# Patient Record
Sex: Male | Born: 1965
Health system: Southern US, Community
[De-identification: ages and names within clinical notes are randomized; demographics above are authoritative.]

## PROBLEM LIST (undated history)

## (undated) DIAGNOSIS — S86019A Strain of unspecified Achilles tendon, initial encounter: Secondary | ICD-10-CM

## (undated) HISTORY — DX: Strain of unspecified achilles tendon, initial encounter: S86.019A

## (undated) HISTORY — PX: EYE MUSCLE SURGERY: SHX370

---

## 2013-01-01 ENCOUNTER — Emergency Department (HOSPITAL_COMMUNITY)
Admission: EM | Admit: 2013-01-01 | Discharge: 2013-01-01 | Disposition: A | Discharge status: Home / Self Care | Payer: No Typology Code available for payment source | Attending: Emergency Medicine | Admitting: Emergency Medicine

## 2013-01-01 ENCOUNTER — Encounter (HOSPITAL_COMMUNITY): Payer: Self-pay | Admitting: Emergency Medicine

## 2013-01-01 DIAGNOSIS — K047 Periapical abscess without sinus: Secondary | ICD-10-CM

## 2013-01-01 MED ORDER — PENICILLIN V POTASSIUM 250 MG PO TABS
500.0000 mg | ORAL_TABLET | Freq: Once | ORAL | Status: AC
  Administered 2013-01-01: 500 mg via ORAL
  Filled 2013-01-01: qty 2

## 2013-01-01 MED ORDER — OXYCODONE-ACETAMINOPHEN 5-325 MG PO TABS
2.0000 | ORAL_TABLET | Freq: Once | ORAL | Status: AC
  Administered 2013-01-01: 2 via ORAL
  Filled 2013-01-01: qty 2

## 2013-01-01 MED ORDER — OXYCODONE-ACETAMINOPHEN 5-325 MG PO TABS: 1.0000 | ORAL_TABLET | ORAL | Status: DC | PRN

## 2013-01-01 MED ORDER — PENICILLIN V POTASSIUM 500 MG PO TABS: 500.0000 mg | ORAL_TABLET | Freq: Three times a day (TID) | ORAL | Status: DC

## 2013-01-01 NOTE — ED Notes (Signed)
Pt c/o left lower dental pain x several days

## 2013-01-01 NOTE — ED Provider Notes (Signed)
   History    This chart was scribed for non-physician practitioner Arnoldo Hooker PA-C working with Carleene Cooper III, MD by Donne Anon, ED Scribe. This patient was seen in room TR08C/TR08C and the patient's care was started at 1825.  CSN: [id number] Arrival date & time 01/01/13  1704  None    Chief Complaint  Patient presents with  . Dental Pain    The history is provided by the patient. No language interpreter was used.   HPI Comments: Cameron Howell is a 47 y.o. male who presents to the Emergency Department complaining of several days of gradual onset, gradually worsening, constant, severe lower left side dental pain that began swelling today. He denies fever.   History reviewed. No pertinent past medical history. History reviewed. No pertinent past surgical history. History reviewed. No pertinent family history. History  Substance Use Topics  . Smoking status: Never Smoker   . Smokeless tobacco: Not on file  . Alcohol Use: No    Review of Systems  HENT: Positive for dental problem. Negative for trouble swallowing.   All other systems reviewed and are negative.    Allergies  Review of patient's allergies indicates no known allergies.  Home Medications  No current outpatient prescriptions on file. BP 132/83  Pulse 74  Temp(Src) 98 F (36.7 C) (Oral)  Resp 18  SpO2 98% Physical Exam  Nursing note and vitals reviewed. Constitutional: He appears well-developed and well-nourished. No distress.  HENT:  Head: Normocephalic and atraumatic.  Partially edentulous. #22 with significant decay. Moderate swelling to adjacent gum tissue. Tender to palpation. Submental adenopathy. Mild left sided facial swelling consistent with dental abscess.   Eyes: Conjunctivae are normal.  Neck: Neck supple. No tracheal deviation present.  Cardiovascular: Normal rate.   Pulmonary/Chest: Effort normal. No respiratory distress.  Musculoskeletal: Normal range of motion.  Neurological: He is  alert.  Skin: Skin is warm and dry.  Psychiatric: He has a normal mood and affect. His behavior is normal.    ED Course  Procedures (including critical care time) DIAGNOSTIC STUDIES: Oxygen Saturation is 98% on RA, normal by my interpretation.    COORDINATION OF CARE: 6:29 PM Discussed treatment plan which includes pain medication and antibiotics with pt at bedside and pt agreed to plan. Dental referral given. Advised pt to use warm compresses.   Labs Reviewed - No data to display No results found. No diagnosis found.  MDM  Uncomplicated dental abscess. Abx and pain medication.  I personally performed the services described in this documentation, which was scribed in my presence. The recorded information has been reviewed and is accurate.      Arnoldo Hooker, PA-C 01/01/13 1838

## 2013-01-02 NOTE — ED Provider Notes (Signed)
Medical screening examination/treatment/procedure(s) were performed by non-physician practitioner and as supervising physician I was immediately available for consultation/collaboration.   Carleene Cooper III, MD 01/02/13 [phone]

## 2013-03-26 ENCOUNTER — Ambulatory Visit: Payer: PRIVATE HEALTH INSURANCE | Admitting: Family Medicine

## 2013-03-26 DIAGNOSIS — Z0289 Encounter for other administrative examinations: Secondary | ICD-10-CM

## 2013-05-01 ENCOUNTER — Ambulatory Visit: Payer: PRIVATE HEALTH INSURANCE | Admitting: Internal Medicine

## 2013-05-21 ENCOUNTER — Ambulatory Visit: Payer: 59 | Admitting: Internal Medicine

## 2013-05-24 ENCOUNTER — Encounter: Payer: Self-pay | Admitting: Internal Medicine

## 2013-05-24 ENCOUNTER — Ambulatory Visit (INDEPENDENT_AMBULATORY_CARE_PROVIDER_SITE_OTHER): Payer: 59 | Admitting: Internal Medicine

## 2013-05-24 VITALS — BP 120/78 | HR 76 | Temp 98.1°F | Ht 73.5 in | Wt 191.0 lb

## 2013-05-24 DIAGNOSIS — Z113 Encounter for screening for infections with a predominantly sexual mode of transmission: Secondary | ICD-10-CM

## 2013-05-24 DIAGNOSIS — M7660 Achilles tendinitis, unspecified leg: Secondary | ICD-10-CM

## 2013-05-24 DIAGNOSIS — F17200 Nicotine dependence, unspecified, uncomplicated: Secondary | ICD-10-CM

## 2013-05-24 DIAGNOSIS — Z0000 Encounter for general adult medical examination without abnormal findings: Secondary | ICD-10-CM

## 2013-05-24 LAB — LIPID PANEL
HDL: 26.3 mg/dL — ABNORMAL LOW (ref >39.00)
Total CHOL/HDL Ratio: 6
VLDL: 20.4 mg/dL (ref 0.0–40.0)

## 2013-05-24 LAB — COMPREHENSIVE METABOLIC PANEL
ALT: 26 U/L (ref 0–53)
AST: 21 U/L (ref 0–37)
Calcium: 9.3 mg/dL (ref 8.4–10.5)
Chloride: 105 mEq/L (ref 96–112)
Creatinine, Ser: 1 mg/dL (ref 0.4–1.5)
Sodium: 140 mEq/L (ref 135–145)
Total Protein: 7.4 g/dL (ref 6.0–8.3)

## 2013-05-24 LAB — TSH: TSH: 0.71 u[IU]/mL (ref 0.35–5.50)

## 2013-05-24 LAB — CBC
Hemoglobin: 14.6 g/dL (ref 13.0–17.0)
MCHC: 33.5 g/dL (ref 30.0–36.0)
RDW: 13.3 % (ref 11.5–14.6)

## 2013-05-24 MED ORDER — VARENICLINE TARTRATE 0.5 MG X 11 & 1 MG X 42 PO MISC: ORAL | Status: DC

## 2013-05-24 NOTE — Progress Notes (Signed)
HPI Pt presents to the clinic today to establish care. He recently moved from [place]. He has not had a physical in > 2 years and would like to get one today. He does have some concern today about some of his left Achilles heel- he did snap it in the past and he feels like he does not get good ROM. He would like referral to a specialist. He also has some concerns with smoking. He would like to quit. He has tried Chantix and the patches in the past without good success. He is wondering what other options there are.  Flu: never Tetanus: unsure Eye Doctor: yearly Dentist: as needed  History reviewed. No pertinent past medical history.  No current outpatient prescriptions on file.   No current facility-administered medications for this visit.    No Known Allergies  Family History  Problem Relation Age of Onset  . Cancer Mother   . Diabetes Mother   . Cancer Father     History   Social History  . Marital Status: Married    Spouse Name: N/A    Number of Children: N/A  . Years of Education: N/A   Occupational History  . Not on file.   Social History Main Topics  . Smoking status: Current Every Day Smoker -- 1.00 packs/day    Types: Cigarettes  . Smokeless tobacco: Not on file     Comment: 20 years smoking  . Alcohol Use: No  . Drug Use: No  . Sexual Activity: Not on file   Other Topics Concern  . Not on file   Social History Narrative  . No narrative on file    ROS:  Constitutional: Denies fever, malaise, fatigue, headache or abrupt weight changes.  HEENT: Denies eye pain, eye redness, ear pain, ringing in the ears, wax buildup, runny nose, nasal congestion, bloody nose, or sore throat. Respiratory: Denies difficulty breathing, shortness of breath, cough or sputum production.   Cardiovascular: Denies chest pain, chest tightness, palpitations or swelling in the hands or feet.  Gastrointestinal: Denies abdominal pain, bloating, constipation, diarrhea or blood in the  stool.  GU: He does have difficulty maintaining an erection at times. Denies frequency, urgency, pain with urination, blood in urine, odor or discharge. Musculoskeletal: Pt reports pain in left achilles heel. Denies difficulty with gait, muscle pain or joint pain and swelling.  Skin: Denies redness, rashes, lesions or ulcercations.  Neurological: Denies dizziness, difficulty with memory, difficulty with speech or problems with balance and coordination.   No other specific complaints in a complete review of systems (except as listed in HPI above).  PE:  BP 120/78  Pulse 76  Temp(Src) 98.1 F (36.7 C) (Oral)  Ht 6' 1.5" (1.867 m)  Wt 191 lb (86.637 kg)  BMI 24.86 kg/m2  SpO2 99% Wt Readings from Last 3 Encounters:  05/24/13 191 lb (86.637 kg)    General: Appears his stated age, well developed, well nourished in NAD. HEENT: Head: normal shape and size; Eyes: sclera white, no icterus, conjunctiva pink, PERRLA and EOMs intact; Ears: Tm's gray and intact, normal light reflex; Nose: mucosa pink and moist, septum midline; Throat/Mouth: Teeth present, mucosa pink and moist, no lesions or ulcerations noted.  Neck: Normal range of motion. Neck supple, trachea midline. No massses, lumps or thyromegaly present.  Cardiovascular: Normal rate and rhythm. S1,S2 noted.  No murmur, rubs or gallops noted. No JVD or BLE edema. No carotid bruits noted. Pulmonary/Chest: Normal effort and positive vesicular breath sounds.  No respiratory distress. No wheezes, rales or ronchi noted.  Abdomen: Soft and nontender. Normal bowel sounds, no bruits noted. No distention or masses noted. Liver, spleen and kidneys non palpable. Musculoskeletal: Normal range of motion. No signs of joint swelling. No difficulty with gait. Mild weakness noted in the LLE, strength 4/5 LLE, 5/5 RLE. Neurological: Alert and oriented. Cranial nerves II-XII intact. Coordination normal. +DTRs bilaterally. Psychiatric: Mood and affect normal.  Behavior is normal. Judgment and thought content normal.      Assessment and Plan:  Preventative Health Maintenance:  Pt declines flu and tetanus vaccines He would like screening labs today including for screening for STD's Smoking cessation counseling and education provided- pt would like to try Chantix again  Left achilles heel pain:  I will have him make an appt with Dr. Patsy Lager for further evaluation  RTC in 1 year or sooner if needed

## 2013-05-24 NOTE — Progress Notes (Signed)
Pre-visit discussion using our clinic review tool. No additional management support is needed unless otherwise documented below in the visit note.  

## 2013-05-24 NOTE — Addendum Note (Signed)
Addended by: Baldomero Lamy on: 05/24/2013 10:36 AM   Modules accepted: Orders

## 2013-05-24 NOTE — Patient Instructions (Addendum)
Health Maintenance, Males A healthy lifestyle and preventative care can promote health and wellness.  Maintain regular health, dental, and eye exams.  Eat a healthy diet. Foods like vegetables, fruits, whole grains, low-fat dairy products, and lean protein foods contain the nutrients you need without too many calories. Decrease your intake of foods high in solid fats, added sugars, and salt. Get information about a proper diet from your caregiver, if necessary.  Regular physical exercise is one of the most important things you can do for your health. Most adults should get at least 150 minutes of moderate-intensity exercise (any activity that increases your heart rate and causes you to sweat) each week. In addition, most adults need muscle-strengthening exercises on 2 or more days a week.   Maintain a healthy weight. The body mass index (BMI) is a screening tool to identify possible weight problems. It provides an estimate of body fat based on height and weight. Your caregiver can help determine your BMI, and can help you achieve or maintain a healthy weight. For adults 20 years and older:  A BMI below 18.5 is considered underweight.  A BMI of 18.5 to 24.9 is normal.  A BMI of 25 to 29.9 is considered overweight.  A BMI of 30 and above is considered obese.  Maintain normal blood lipids and cholesterol by exercising and minimizing your intake of saturated fat. Eat a balanced diet with plenty of fruits and vegetables. Blood tests for lipids and cholesterol should begin at age 20 and be repeated every 5 years. If your lipid or cholesterol levels are high, you are over 50, or you are a high risk for heart disease, you may need your cholesterol levels checked more frequently.Ongoing high lipid and cholesterol levels should be treated with medicines, if diet and exercise are not effective.  If you smoke, find out from your caregiver how to quit. If you do not use tobacco, do not start.  Lung  cancer screening is recommended for adults aged 55 80 years who are at high risk for developing lung cancer because of a history of smoking. Yearly low-dose computed tomography (CT) is recommended for people who have at least a 30-pack-year history of smoking and are a current smoker or have quit within the past 15 years. A pack year of smoking is smoking an average of 1 pack of cigarettes a day for 1 year (for example: 1 pack a day for 30 years or 2 packs a day for 15 years). Yearly screening should continue until the smoker has stopped smoking for at least 15 years. Yearly screening should also be stopped for people who develop a health problem that would prevent them from having lung cancer treatment.  If you choose to drink alcohol, do not exceed 2 drinks per day. One drink is considered to be 12 ounces (355 mL) of beer, 5 ounces (148 mL) of wine, or 1.5 ounces (44 mL) of liquor.  Avoid use of street drugs. Do not share needles with anyone. Ask for help if you need support or instructions about stopping the use of drugs.  High blood pressure causes heart disease and increases the risk of stroke. Blood pressure should be checked at least every 1 to 2 years. Ongoing high blood pressure should be treated with medicines if weight loss and exercise are not effective.  If you are 45 to 48 years old, ask your caregiver if you should take aspirin to prevent heart disease.  Diabetes screening involves taking a blood   sample to check your fasting blood sugar level. This should be done once every 3 years, after age 7, if you are within normal weight and without risk factors for diabetes. Testing should be considered at a younger age or be carried out more frequently if you are overweight and have at least 1 risk factor for diabetes.  Colorectal cancer can be detected and often prevented. Most routine colorectal cancer screening begins at the age of 69 and continues through age 76. However, your caregiver may  recommend screening at an earlier age if you have risk factors for colon cancer. On a yearly basis, your caregiver may provide home test kits to check for hidden blood in the stool. Use of a small camera at the end of a tube, to directly examine the colon (sigmoidoscopy or colonoscopy), can detect the earliest forms of colorectal cancer. Talk to your caregiver about this at age 68, when routine screening begins. Direct examination of the colon should be repeated every 5 to 10 years through age 66, unless early forms of pre-cancerous polyps or small growths are found.  Hepatitis C blood testing is recommended for all people born from [birth year] through 1966 and any individual with known risks for hepatitis C.  Healthy men should no longer receive prostate-specific antigen (PSA) blood tests as part of routine cancer screening. Consult with your caregiver about prostate cancer screening.  Testicular cancer screening is not recommended for adolescents or adult males who have no symptoms. Screening includes self-exam, caregiver exam, and other screening tests. Consult with your caregiver about any symptoms you have or any concerns you have about testicular cancer.  Practice safe sex. Use condoms and avoid high-risk sexual practices to reduce the spread of sexually transmitted infections (STIs).  Use sunscreen. Apply sunscreen liberally and repeatedly throughout the day. You should seek shade when your shadow is shorter than you. Protect yourself by wearing long sleeves, pants, a wide-brimmed hat, and sunglasses year round, whenever you are outdoors.  Notify your caregiver of new moles or changes in moles, especially if there is a change in shape or color. Also notify your caregiver if a mole is larger than the size of a pencil eraser.  A one-time screening for abdominal aortic aneurysm (AAA) and surgical repair of large AAAs by sound wave imaging (ultrasonography) is recommended for ages 37 to 88 years who are  current or former smokers.  Stay current with your immunizations. Document Released: 11/27/2007 Document Revised: 09/25/2012 Document Reviewed: 10/26/2010 [facility] Patient Information 2014 [place], [place]. Smoking Cessation, Tips for Success YOU CAN QUIT SMOKING If you are ready to quit smoking, congratulations! You have chosen to help yourself be healthier. Cigarettes bring nicotine, tar, carbon monoxide, and other irritants into your body. Your lungs, heart, and blood vessels will be able to work better without these poisons. There are many different ways to quit smoking. Nicotine gum, nicotine patches, a nicotine inhaler, or nicotine nasal spray can help with physical craving. Hypnosis, support groups, and medicines help break the habit of smoking. Here are some tips to help you quit for good.  Throw away all cigarettes.  Clean and remove all ashtrays from your home, work, and car.  On a card, write down your reasons for quitting. Carry the card with you and read it when you get the urge to smoke.  Cleanse your body of nicotine. Drink enough water and fluids to keep your urine clear or pale yellow. Do this after quitting to flush the nicotine from  your body.  Learn to predict your moods. Do not let a bad situation be your excuse to have a cigarette. Some situations in your life might tempt you into wanting a cigarette.  Never have "just one" cigarette. It leads to wanting another and another. Remind yourself of your decision to quit.  Change habits associated with smoking. If you smoked while driving or when feeling stressed, try other activities to replace smoking. Stand up when drinking your coffee. Brush your teeth after eating. Sit in a different chair when you read the paper. Avoid alcohol while trying to quit, and try to drink fewer caffeinated beverages. Alcohol and caffeine may urge you to smoke.  Avoid foods and drinks that can trigger a desire to smoke, such as sugary or spicy  foods and alcohol.  Ask people who smoke not to smoke around you.  Have something planned to do right after eating or having a cup of coffee. Take a walk or exercise to perk you up. This will help to keep you from overeating.  Try a relaxation exercise to calm you down and decrease your stress. Remember, you may be tense and nervous for the first 2 weeks after you quit, but this will pass.  Find new activities to keep your hands busy. Play with a pen, coin, or rubber band. Doodle or draw things on paper.  Brush your teeth right after eating. This will help cut down on the craving for the taste of tobacco after meals. You can try mouthwash, too.  Use oral substitutes, such as lemon drops, carrots, a cinnamon stick, or chewing gum, in place of cigarettes. Keep them handy so they are available when you have the urge to smoke.  When you have the urge to smoke, try deep breathing.  Designate your home as a nonsmoking area.  If you are a heavy smoker, ask your caregiver about a prescription for nicotine chewing gum. It can ease your withdrawal from nicotine.  Reward yourself. Set aside the cigarette money you save and buy yourself something nice.  Look for support from others. Join a support group or smoking cessation program. Ask someone at home or at work to help you with your plan to quit smoking.  Always ask yourself, "Do I need this cigarette or is this just a reflex?" Tell yourself, "Today, I choose not to smoke," or "I do not want to smoke." You are reminding yourself of your decision to quit, even if you do smoke a cigarette. HOW WILL I FEEL WHEN I QUIT SMOKING?  The benefits of not smoking start within days of quitting.  You may have symptoms of withdrawal because your body is used to nicotine (the addictive substance in cigarettes). You may crave cigarettes, be irritable, feel very hungry, cough often, get headaches, or have difficulty concentrating.  The withdrawal symptoms are  only temporary. They are strongest when you first quit but will go away within 10 to 14 days.  When withdrawal symptoms occur, stay in control. Think about your reasons for quitting. Remind yourself that these are signs that your body is healing and getting used to being without cigarettes.  Remember that withdrawal symptoms are easier to treat than the major diseases that smoking can cause.  Even after the withdrawal is over, expect periodic urges to smoke. However, these cravings are generally short-lived and will go away whether you smoke or not. Do not smoke!  If you relapse and smoke again, do not lose hope. Most smokers quit  3 times before they are successful.  If you relapse, do not give up! Plan ahead and think about what you will do the next time you get the urge to smoke. LIFE AS A NONSMOKER: MAKE IT FOR A MONTH, MAKE IT FOR LIFE Day 1: Hang this page where you will see it every day. Day 2: Get rid of all ashtrays, matches, and lighters. Day 3: Drink water. Breathe deeply between sips. Day 4: Avoid places with smoke-filled air, such as bars, clubs, or the smoking section of restaurants. Day 5: Keep track of how much money you save by not smoking. Day 6: Avoid boredom. Keep a good book with you or go to the movies. Day 7: Reward yourself! One week without smoking! Day 8: Make a dental appointment to get your teeth cleaned. Day 9: Decide how you will turn down a cigarette before it is offered to you. Day 10: Review your reasons for quitting. Day 11: Distract yourself. Stay active to keep your mind off smoking and to relieve tension. Take a walk, exercise, read a book, do a crossword puzzle, or try a new hobby. Day 12: Exercise. Get off the bus before your stop or use stairs instead of escalators. Day 13: Call on friends for support and encouragement. Day 14: Reward yourself! Two weeks without smoking! Day 15: Practice deep breathing exercises. Day 16: Bet a friend that you can stay  a nonsmoker. Day 17: Ask to sit in nonsmoking sections of restaurants. Day 18: Hang up "No Smoking" signs. Day 19: Think of yourself as a nonsmoker. Day 20: Each morning, tell yourself you will not smoke. Day 21: Reward yourself! Three weeks without smoking! Day 22: Think of smoking in negative ways. Remember how it stains your teeth, gives you bad breath, and leaves you short of breath. Day 23: Eat a nutritious breakfast. Day 24:Do not relive your days as a smoker. Day 25: Hold a pencil in your hand when talking on the telephone. Day 26: Tell all your friends you do not smoke. Day 27: Think about how much better food tastes. Day 28: Remember, one cigarette is one too many. Day 29: Take up a hobby that will keep your hands busy. Day 30: Congratulations! One month without smoking! Give yourself a big reward. Your caregiver can direct you to community resources or hospitals for support, which may include:  Group support.  Education.  Hypnosis.  Subliminal therapy. Document Released: 02/27/2004 Document Revised: 08/23/2011 Document Reviewed: 11/16/2012 [facility] Patient Information 2014 [place], [place]. You Can Quit Smoking If you are ready to quit smoking or are thinking about it, congratulations! You have chosen to help yourself be healthier and live longer! There are lots of different ways to quit smoking. Nicotine gum, nicotine patches, a nicotine inhaler, or nicotine nasal spray can help with physical craving. Hypnosis, support groups, and medicines help break the habit of smoking. TIPS TO GET OFF AND STAY OFF CIGARETTES  Learn to predict your moods. Do not let a bad situation be your excuse to have a cigarette. Some situations in your life might tempt you to have a cigarette.  Ask friends and co-workers not to smoke around you.  Make your home smoke-free.  Never have "just one" cigarette. It leads to wanting another and another. Remind yourself of your decision to quit.  On a  card, make a list of your reasons for not smoking. Read it at least the same number of times a day as you have a cigarette.  Tell yourself everyday, "I do not want to smoke. I choose not to smoke."  Ask someone at home or work to help you with your plan to quit smoking.  Have something planned after you eat or have a cup of coffee. Take a walk or get other exercise to perk you up. This will help to keep you from overeating.  Try a relaxation exercise to calm you down and decrease your stress. Remember, you may be tense and nervous the first two weeks after you quit. This will pass.  Find new activities to keep your hands busy. Play with a pen, coin, or rubber band. Doodle or draw things on paper.  Brush your teeth right after eating. This will help cut down the craving for the taste of tobacco after meals. You can try mouthwash too.  Try gum, breath mints, or diet candy to keep something in your mouth. IF YOU SMOKE AND WANT TO QUIT:  Do not stock up on cigarettes. Never buy a carton. Wait until one pack is finished before you buy another.  Never carry cigarettes with you at work or at home.  Keep cigarettes as far away from you as possible. Leave them with someone else.  Never carry matches or a lighter with you.  Ask yourself, "Do I need this cigarette or is this just a reflex?"  Bet with someone that you can quit. Put cigarette money in a piggy bank every morning. If you smoke, you give up the money. If you do not smoke, by the end of the week, you keep the money.  Keep trying. It takes 21 days to change a habit!  Talk to your doctor about using medicines to help you quit. These include nicotine replacement gum, lozenges, or skin patches. Document Released: 03/27/2009 Document Revised: 08/23/2011 Document Reviewed: 03/27/2009 [facility] Patient Information 2014 [place], [place].

## 2013-05-25 LAB — RPR

## 2013-05-28 ENCOUNTER — Ambulatory Visit (INDEPENDENT_AMBULATORY_CARE_PROVIDER_SITE_OTHER): Payer: 59 | Admitting: Family Medicine

## 2013-05-28 ENCOUNTER — Encounter: Payer: Self-pay | Admitting: Family Medicine

## 2013-05-28 VITALS — BP 110/78 | HR 79 | Temp 97.9°F | Ht 73.5 in | Wt 193.5 lb

## 2013-05-28 DIAGNOSIS — F17200 Nicotine dependence, unspecified, uncomplicated: Secondary | ICD-10-CM

## 2013-05-28 DIAGNOSIS — S86012A Strain of left Achilles tendon, initial encounter: Secondary | ICD-10-CM

## 2013-05-28 DIAGNOSIS — Z720 Tobacco use: Secondary | ICD-10-CM

## 2013-05-28 DIAGNOSIS — S93499A Sprain of other ligament of unspecified ankle, initial encounter: Secondary | ICD-10-CM

## 2013-05-28 NOTE — Progress Notes (Signed)
Date:  05/28/2013   Name:  Cameron Howell   DOB:  1965/09/20   MRN:  [id number] Gender: male Age: 47 y.o.  Primary Physician: Nicki Reaper, NP  Dear Mrs. Baity,  Thank you for having me see Cameron Howell in consultation today at [facility] at [facility] for his problem with Left ankle pain.  As you may recall, he is a 47 y.o. year old male with a history of atraumatic injury while playing basketball in [place] that he felt on the posterior aspect of his ankle. This occurred approximately 48 years ago, and the patient did not have health insurance at that time. He went to the emergency room, but he never had any followup. The emergency room physician told him that he may have injured his Achilles tendon. The patient never had surgery, and he did not do any form of serial casting or heal elevation over time.   He describes being unable to plantar flex at all and minimally to not being able to put pressure on that LEFT foot for weeks after his injury. Subsequently, he is able to do so now, but he does have a slight limp, and he does also have some chronic pain and pain mostly in the proximal section.  He is here primarily for advice on anything he can do to help his situation.  Past Medical History  Diagnosis Date  . Achilles tendon rupture 05/29/2013    No past surgical history on file.  History   Social History  . Marital Status: Married    Spouse Name: N/A    Number of Children: N/A  . Years of Education: N/A   Social History Main Topics  . Smoking status: Current Every Day Smoker -- 1.00 packs/day    Types: Cigarettes  . Smokeless tobacco: Never Used     Comment: 20 years smoking  . Alcohol Use: No  . Drug Use: No  . Sexual Activity: Yes   Other Topics Concern  . None   Social History Narrative  . None    Family History  Problem Relation Age of Onset  . Cancer Mother   . Diabetes Mother   . Hypertension Mother   . Cancer Father   . Hypertension Father      Medications and Allergies reviewed  Review of Systems:    GEN: No fevers, chills. Nontoxic. Primarily MSK c/o today. MSK: Detailed in the HPI GI: tolerating PO intake without difficulty Neuro: No numbness, parasthesias, or tingling associated. Otherwise, the pertinent positives and negatives are listed above and in the HPI, otherwise a full review of systems has been reviewed and is negative unless noted positive.   Physical Examination: BP 110/78  Pulse 79  Temp(Src) 97.9 F (36.6 C) (Oral)  Ht 6' 1.5" (1.867 m)  Wt 193 lb 8 oz (87.771 kg)  BMI 25.18 kg/m2    GEN: WDWN, NAD, Non-toxic, Alert & Oriented x 3 HEENT: Atraumatic, Normocephalic.  Ears and Nose: No external deformity. EXTR: No clubbing/cyanosis/edema NEURO: antalgic gait favoring the Left PSYCH: Normally interactive. Conversant. Not depressed or anxious appearing.  Calm demeanor.   The RIGHT foot examination and ankle examination are grossly unremarkable. The forefoot, midfoot, and hindfoot, as well as the ankle has grossly nontender bony anatomy. The Achilles tendon is intact with a functioning Thompson's test on the RIGHT. No other ligaments are tender and all are stable.  On the LEFT, the patient's foot and ankle exam shows nontender forefoot, nontender midfoot with  no tenderness around the peroneal tendons or posterior tibialis tendons. The medial and lateral malleoli are nontender. Anterior drawer test and subtalar tilt testing is normal.  The Achilles tendon has an area where there is an inward dip proximal to the insertion point. It does not appear to be fully congruous tendon. The patient is able to plantar flex his foot against resistance. Minimal motion on his Thompson's test.  Objective Data: Diagnostic Ultrasound Evaluation Terason t3000, MSK ultrasound, MSK probe Anatomy scanned: right and left achilles tendon Indication: Pain Findings: on the RIGHT side, the patient's Achilles tendon is easily  visualized on longitudinal and transverse views with excellent attachment, incongruence tendon without evidence of any hypo-echogenicity. No significant retrocalcaneal bursitis. Easily visualized from its insertion all the way to the calf musculature. On the LEFT side, there is a significant area of anechoic or significantly hypoechoic area proximal to the calcaneus, where one typically sees the Achilles tendon. This is most easily visualized on transverse view, but this can also be seen on longitudinal view as well. Proximally, there is some evidence of retracted tendon that appears to be adhered at this point, and does not move with manipulation of the foot and ankle. This would be consistent with probable full thickness tear of Achilles tendon with retraction. Fibers of the plantaris tendon may be intact, and the patient is able to achieve some plantar flexion.  Assessment and Plan: 1. Full thickness achilles tendon rupture on the LEFT with retraction, now 2 years out with some plantarflexion intact now, and chronic pain from this injury.  Recommendations: this is a challenging case, where the patient had a severe injury 2 years ago, but ultimately did not get medical care at the time of his injury. He understands that this may inhibit his ultimate outcome. I think that the best course for this patient is to do some cardiovascular his rehabilitation with physical therapy. I'm going to discuss this case with one of my surgical colleagues as well, but I think that 2 years out from his initial injury would make any type of surgical Achilles repair not ideal at this point.  We will see the patient back after his rehabilitation has begun and after I fully decide his disposition.  Thank you for having [place] see Cameron Howell in consultation.  Feel free to contact me with any questions.  Updated Medication List:   Medication List       This list is accurate as of: 05/28/13 11:59 PM.  Always use your most recent  med list.               varenicline 0.5 MG X 11 & 1 MG X 42 tablet  Commonly known as:  CHANTIX STARTING MONTH PAK  Take one 0.5 mg tablet by mouth once daily for 3 days, then increase to one 0.5 mg tablet twice daily for 4 days, then increase to one 1 mg tablet twice daily.        Signed,  Elpidio Galea. Jacobus Colvin, MD, CAQ Sports Medicine  [employer] at [facility] [address] [place], [place] [address] Phone: [phone] Fax: [phone]

## 2013-05-28 NOTE — Progress Notes (Signed)
Pre-visit discussion using our clinic review tool. No additional management support is needed unless otherwise documented below in the visit note.  

## 2013-05-29 ENCOUNTER — Other Ambulatory Visit: Payer: Self-pay

## 2013-05-29 ENCOUNTER — Encounter: Payer: Self-pay | Admitting: Family Medicine

## 2013-05-29 ENCOUNTER — Telehealth: Payer: Self-pay

## 2013-05-29 DIAGNOSIS — S86019A Strain of unspecified Achilles tendon, initial encounter: Secondary | ICD-10-CM

## 2013-05-29 DIAGNOSIS — F17200 Nicotine dependence, unspecified, uncomplicated: Secondary | ICD-10-CM | POA: Insufficient documentation

## 2013-05-29 DIAGNOSIS — Z720 Tobacco use: Secondary | ICD-10-CM | POA: Insufficient documentation

## 2013-05-29 HISTORY — DX: Strain of unspecified achilles tendon, initial encounter: S86.019A

## 2013-05-29 NOTE — Telephone Encounter (Signed)
Rx for Chantix faxed to pharmacy. 

## 2013-05-29 NOTE — Telephone Encounter (Signed)
Opened in error

## 2013-09-06 ENCOUNTER — Ambulatory Visit: Payer: PRIVATE HEALTH INSURANCE | Admitting: Internal Medicine

## 2013-12-31 ENCOUNTER — Ambulatory Visit (INDEPENDENT_AMBULATORY_CARE_PROVIDER_SITE_OTHER): Payer: 59 | Admitting: Internal Medicine

## 2013-12-31 ENCOUNTER — Encounter: Payer: Self-pay | Admitting: Internal Medicine

## 2013-12-31 VITALS — BP 128/78 | HR 76 | Temp 98.0°F | Wt 184.0 lb

## 2013-12-31 DIAGNOSIS — S139XXA Sprain of joints and ligaments of unspecified parts of neck, initial encounter: Secondary | ICD-10-CM

## 2013-12-31 DIAGNOSIS — M5412 Radiculopathy, cervical region: Secondary | ICD-10-CM

## 2013-12-31 DIAGNOSIS — S161XXA Strain of muscle, fascia and tendon at neck level, initial encounter: Secondary | ICD-10-CM

## 2013-12-31 MED ORDER — PREDNISONE 10 MG PO TABS: ORAL_TABLET | ORAL | Status: DC

## 2013-12-31 NOTE — Patient Instructions (Addendum)
Cervical Strain and Sprain (Whiplash) with Rehab Cervical strain and sprains are injuries that commonly occur with "whiplash" injuries. Whiplash occurs when the neck is forcefully whipped backward or forward, such as during a motor vehicle accident. The muscles, ligaments, tendons, discs and nerves of the neck are susceptible to injury when this occurs. SYMPTOMS   Pain or stiffness in the front and/or back of neck  Symptoms may present immediately or up to 24 hours after injury.  Dizziness, headache, nausea and vomiting.  Muscle spasm with soreness and stiffness in the neck.  Tenderness and swelling at the injury site. CAUSES  Whiplash injuries often occur during contact sports or motor vehicle accidents.  RISK INCREASES WITH:  Osteoarthritis of the spine.  Situations that make head or neck accidents or trauma more likely.  High-risk sports (football, rugby, wrestling, hockey, auto racing, gymnastics, diving, contact karate or boxing).  Poor strength and flexibility of the neck.  Previous neck injury.  Poor tackling technique.  Improperly fitted or padded equipment. PREVENTION  Learn and use proper technique (avoid tackling with the head, spearing and head-butting; use proper falling techniques to avoid landing on the head).  Warm up and stretch properly before activity.  Maintain physical fitness:  Strength, flexibility and endurance.  Cardiovascular fitness.  Wear properly fitted and padded protective equipment, such as padded soft collars, for participation in contact sports. PROGNOSIS  Recovery for cervical strain and sprain injuries is dependent on the extent of the injury. These injuries are usually curable in 1 week to 3 months with appropriate treatment.  RELATED COMPLICATIONS   Temporary numbness and weakness may occur if the nerve roots are damaged, and this may persist until the nerve has completely healed.  Chronic pain due to frequent recurrence of  symptoms.  Prolonged healing, especially if activity is resumed too soon (before complete recovery). TREATMENT  Treatment initially involves the use of ice and medication to help reduce pain and inflammation. It is also important to perform strengthening and stretching exercises and modify activities that worsen symptoms so the injury does not get worse. These exercises may be performed at home or with a therapist. For patients who experience severe symptoms, a soft padded collar may be recommended to be worn around the neck.  Improving your posture may help reduce symptoms. Posture improvement includes pulling your chin and abdomen in while sitting or standing. If you are sitting, sit in a firm chair with your buttocks against the back of the chair. While sleeping, try replacing your pillow with a small towel rolled to 2 inches in diameter, or use a cervical pillow or soft cervical collar. Poor sleeping positions delay healing.  For patients with nerve root damage, which causes numbness or weakness, the use of a cervical traction apparatus may be recommended. Surgery is rarely necessary for these injuries. However, cervical strain and sprains that are present at birth (congenital) may require surgery. MEDICATION   If pain medication is necessary, nonsteroidal anti-inflammatory medications, such as aspirin and ibuprofen, or other minor pain relievers, such as acetaminophen, are often recommended.  Do not take pain medication for 7 days before surgery.  Prescription pain relievers may be given if deemed necessary by your caregiver. Use only as directed and only as much as you need. HEAT AND COLD:   Cold treatment (icing) relieves pain and reduces inflammation. Cold treatment should be applied for 10 to 15 minutes every 2 to 3 hours for inflammation and pain and immediately after any activity that  aggravates your symptoms. Use ice packs or an ice massage.  Heat treatment may be used prior to  performing the stretching and strengthening activities prescribed by your caregiver, physical therapist, or athletic trainer. Use a heat pack or a warm soak. SEEK MEDICAL CARE IF:   Symptoms get worse or do not improve in 2 weeks despite treatment.  New, unexplained symptoms develop (drugs used in treatment may produce side effects). EXERCISES RANGE OF MOTION (ROM) AND STRETCHING EXERCISES - Cervical Strain and Sprain These exercises may help you when beginning to rehabilitate your injury. In order to successfully resolve your symptoms, you must improve your posture. These exercises are designed to help reduce the forward-head and rounded-shoulder posture which contributes to this condition. Your symptoms may resolve with or without further involvement from your physician, physical therapist or athletic trainer. While completing these exercises, remember:   Restoring tissue flexibility helps normal motion to return to the joints. This allows healthier, less painful movement and activity.  An effective stretch should be held for at least 20 seconds, although you may need to begin with shorter hold times for comfort.  A stretch should never be painful. You should only feel a gentle lengthening or release in the stretched tissue. STRETCH- Axial Extensors  Lie on your back on the floor. You may bend your knees for comfort. Place a rolled up hand towel or dish towel, about 2 inches in diameter, under the part of your head that makes contact with the floor.  Gently tuck your chin, as if trying to make a "double chin," until you feel a gentle stretch at the base of your head.  Hold __________ seconds. Repeat __________ times. Complete this exercise __________ times per day.  STRETECH - Axial Extension   Stand or sit on a firm surface. Assume a good posture: chest up, shoulders drawn back, abdominal muscles slightly tense, knees unlocked (if standing) and feet hip width apart.  Slowly retract your  chin so your head slides back and your chin slightly lowers.Continue to look straight ahead.  You should feel a gentle stretch in the back of your head. Be certain not to feel an aggressive stretch since this can cause headaches later.  Hold for __________ seconds. Repeat __________ times. Complete this exercise __________ times per day. STRETCH - Cervical Side Bend   Stand or sit on a firm surface. Assume a good posture: chest up, shoulders drawn back, abdominal muscles slightly tense, knees unlocked (if standing) and feet hip width apart.  Without letting your nose or shoulders move, slowly tip your right / left ear to your shoulder until your feel a gentle stretch in the muscles on the opposite side of your neck.  Hold __________ seconds. Repeat __________ times. Complete this exercise __________ times per day. STRETCH - Cervical Rotators   Stand or sit on a firm surface. Assume a good posture: chest up, shoulders drawn back, abdominal muscles slightly tense, knees unlocked (if standing) and feet hip width apart.  Keeping your eyes level with the ground, slowly turn your head until you feel a gentle stretch along the back and opposite side of your neck.  Hold __________ seconds. Repeat __________ times. Complete this exercise __________ times per day. RANGE OF MOTION - Neck Circles   Stand or sit on a firm surface. Assume a good posture: chest up, shoulders drawn back, abdominal muscles slightly tense, knees unlocked (if standing) and feet hip width apart.  Gently roll your head down and around from  the back of one shoulder to the back of the other. The motion should never be forced or painful.  Repeat the motion 10-20 times, or until you feel the neck muscles relax and loosen. Repeat __________ times. Complete the exercise __________ times per day. STRENGTHENING EXERCISES - Cervical Strain and Sprain These exercises may help you when beginning to rehabilitate your injury. They may  resolve your symptoms with or without further involvement from your physician, physical therapist or athletic trainer. While completing these exercises, remember:   Muscles can gain both the endurance and the strength needed for everyday activities through controlled exercises.  Complete these exercises as instructed by your physician, physical therapist or athletic trainer. Progress the resistance and repetitions only as guided.  You may experience muscle soreness or fatigue, but the pain or discomfort you are trying to eliminate should never worsen during these exercises. If this pain does worsen, stop and make certain you are following the directions exactly. If the pain is still present after adjustments, discontinue the exercise until you can discuss the trouble with your clinician. STRENGTH - Cervical Flexors, Isometric  Face a wall, standing about 6 inches away. Place a small pillow, a ball about 6-8 inches in diameter, or a folded towel between your forehead and the wall.  Slightly tuck your chin and gently push your forehead into the soft object. Push only with mild to moderate intensity, building up tension gradually. Keep your jaw and forehead relaxed.  Hold 10 to 20 seconds. Keep your breathing relaxed.  Release the tension slowly. Relax your neck muscles completely before you start the next repetition. Repeat __________ times. Complete this exercise __________ times per day. STRENGTH- Cervical Lateral Flexors, Isometric   Stand about 6 inches away from a wall. Place a small pillow, a ball about 6-8 inches in diameter, or a folded towel between the side of your head and the wall.  Slightly tuck your chin and gently tilt your head into the soft object. Push only with mild to moderate intensity, building up tension gradually. Keep your jaw and forehead relaxed.  Hold 10 to 20 seconds. Keep your breathing relaxed.  Release the tension slowly. Relax your neck muscles completely  before you start the next repetition. Repeat __________ times. Complete this exercise __________ times per day. STRENGTH - Cervical Extensors, Isometric   Stand about 6 inches away from a wall. Place a small pillow, a ball about 6-8 inches in diameter, or a folded towel between the back of your head and the wall.  Slightly tuck your chin and gently tilt your head back into the soft object. Push only with mild to moderate intensity, building up tension gradually. Keep your jaw and forehead relaxed.  Hold 10 to 20 seconds. Keep your breathing relaxed.  Release the tension slowly. Relax your neck muscles completely before you start the next repetition. Repeat __________ times. Complete this exercise __________ times per day. POSTURE AND BODY MECHANICS CONSIDERATIONS - Cervical Strain and Sprain Keeping correct posture when sitting, standing or completing your activities will reduce the stress put on different body tissues, allowing injured tissues a chance to heal and limiting painful experiences. The following are general guidelines for improved posture. Your physician or physical therapist will provide you with any instructions specific to your needs. While reading these guidelines, remember:  The exercises prescribed by your provider will help you have the flexibility and strength to maintain correct postures.  The correct posture provides the optimal environment for your joints  to work. All of your joints have less wear and tear when properly supported by a spine with good posture. This means you will experience a healthier, less painful body.  Correct posture must be practiced with all of your activities, especially prolonged sitting and standing. Correct posture is as important when doing repetitive low-stress activities (typing) as it is when doing a single heavy-load activity (lifting). PROLONGED STANDING WHILE SLIGHTLY LEANING FORWARD When completing a task that requires you to lean  forward while standing in one place for a long time, place either foot up on a stationary 2-4 inch high object to help maintain the best posture. When both feet are on the ground, the low back tends to lose its slight inward curve. If this curve flattens (or becomes too large), then the back and your other joints will experience too much stress, fatigue more quickly and can cause pain.  RESTING POSITIONS Consider which positions are most painful for you when choosing a resting position. If you have pain with flexion-based activities (sitting, bending, stooping, squatting), choose a position that allows you to rest in a less flexed posture. You would want to avoid curling into a fetal position on your side. If your pain worsens with extension-based activities (prolonged standing, working overhead), avoid resting in an extended position such as sleeping on your stomach. Most people will find more comfort when they rest with their spine in a more neutral position, neither too rounded nor too arched. Lying on a non-sagging bed on your side with a pillow between your knees, or on your back with a pillow under your knees will often provide some relief. Keep in mind, being in any one position for a prolonged period of time, no matter how correct your posture, can still lead to stiffness. WALKING Walk with an upright posture. Your ears, shoulders and hips should all line-up. OFFICE WORK When working at a desk, create an environment that supports good, upright posture. Without extra support, muscles fatigue and lead to excessive strain on joints and other tissues. CHAIR:  A chair should be able to slide under your desk when your back makes contact with the back of the chair. This allows you to work closely.  The chair's height should allow your eyes to be level with the upper part of your monitor and your hands to be slightly lower than your elbows.  Body position:  Your feet should make contact with the  floor. If this is not possible, use a foot rest.  Keep your ears over your shoulders. This will reduce stress on your neck and low back. Document Released: 05/31/2005 Document Revised: 09/25/2012 Document Reviewed: 09/12/2008 [facility] Patient Information 2015 [place], [place]. This information is not intended to replace advice given to you by your health care provider. Make sure you discuss any questions you have with your health care provider.

## 2013-12-31 NOTE — Progress Notes (Signed)
Pre visit review using our clinic review tool, if applicable. No additional management support is needed unless otherwise documented below in the visit note. 

## 2013-12-31 NOTE — Progress Notes (Signed)
Subjective:    Patient ID: Cameron Howell, male    DOB: 1965-11-02, 48 y.o.   MRN: [id number]  HPI  Pt presents to the clinic today with left side neck pain. He reports this started 2 days ago. He noticed it when he woke up in the morning. He describes it as a stiffness. It has radiated to the right side of his neck as well. He does report difficulty turning his head. He does have sharp pain with movements. He has tried heat, and aspirin with some improvement. He denies headache or visual impairment. He denies any specific injury to the area.  Review of Systems      Past Medical History  Diagnosis Date  . Achilles tendon rupture 05/29/2013    No current outpatient prescriptions on file.   No current facility-administered medications for this visit.    No Known Allergies  Family History  Problem Relation Age of Onset  . Cancer Mother   . Diabetes Mother   . Hypertension Mother   . Cancer Father   . Hypertension Father     History   Social History  . Marital Status: Married    Spouse Name: N/A    Number of Children: N/A  . Years of Education: N/A   Occupational History  . Not on file.   Social History Main Topics  . Smoking status: Current Every Day Smoker -- 1.00 packs/day    Types: Cigarettes  . Smokeless tobacco: Never Used     Comment: 20 years smoking  . Alcohol Use: No  . Drug Use: No  . Sexual Activity: Yes   Other Topics Concern  . Not on file   Social History Narrative  . No narrative on file     Constitutional: Denies fever, malaise, fatigue, headache or abrupt weight changes.  Musculoskeletal: Pt reports neck pain .Denies difficulty with gait, or joint pain and swelling. .  Neurological: Denies dizziness, difficulty with memory, difficulty with speech or problems with balance and coordination.   No other specific complaints in a complete review of systems (except as listed in HPI above).  Objective:   Physical Exam   BP 128/78  Pulse 76   Temp(Src) 98 F (36.7 C) (Oral)  Wt 184 lb (83.462 kg)  SpO2 98% Wt Readings from Last 3 Encounters:  12/31/13 184 lb (83.462 kg)  05/28/13 193 lb 8 oz (87.771 kg)  05/24/13 191 lb (86.637 kg)    General: Appears his stated age, well developed, well nourished in NAD. Cardiovascular: Normal rate and rhythm. S1,S2 noted.  No murmur, rubs or gallops noted. No JVD or BLE edema. No carotid bruits noted. Pulmonary/Chest: Normal effort and positive vesicular breath sounds. No respiratory distress. No wheezes, rales or ronchi noted.  Musculoskeletal: Decreased lateral rotation and extension of the cervical spine. Normal flexion. Strength normal. No pain with palpation of the cervical spine.   BMET    Component Value Date/Time   NA 140 05/24/2013 1030   K 4.2 05/24/2013 1030   CL 105 05/24/2013 1030   CO2 29 05/24/2013 1030   GLUCOSE 95 05/24/2013 1030   BUN 14 05/24/2013 1030   CREATININE 1.0 05/24/2013 1030   CALCIUM 9.3 05/24/2013 1030    Lipid Panel     Component Value Date/Time   CHOL 161 05/24/2013 1030   TRIG 102.0 05/24/2013 1030   HDL 26.30* 05/24/2013 1030   CHOLHDL 6 05/24/2013 1030   VLDL 20.4 05/24/2013 1030   LDLCALC 114* 05/24/2013  1030    CBC    Component Value Date/Time   WBC 5.2 05/24/2013 1030   RBC 4.73 05/24/2013 1030   HGB 14.6 05/24/2013 1030   HCT 43.7 05/24/2013 1030   PLT 215.0 05/24/2013 1030   MCV 92.4 05/24/2013 1030   MCHC 33.5 05/24/2013 1030   RDW 13.3 05/24/2013 1030    Hgb A1C Lab Results  Component Value Date   HGBA1C 6.1 05/24/2013        Assessment & Plan:   Cervical strain/radiculitis:  erx for pred taper If no improvement or worsens, will get xray/MIR of cervical spine ROM exercises given

## 2014-01-01 ENCOUNTER — Other Ambulatory Visit: Payer: Self-pay

## 2014-01-01 DIAGNOSIS — M5412 Radiculopathy, cervical region: Secondary | ICD-10-CM

## 2014-01-01 MED ORDER — PREDNISONE 10 MG PO TABS: ORAL_TABLET | ORAL | Status: DC

## 2014-12-26 ENCOUNTER — Emergency Department (HOSPITAL_COMMUNITY): Payer: 59

## 2014-12-26 ENCOUNTER — Encounter (HOSPITAL_COMMUNITY): Payer: Self-pay | Admitting: Vascular Surgery

## 2014-12-26 DIAGNOSIS — M79675 Pain in left toe(s): Secondary | ICD-10-CM | POA: Diagnosis present

## 2014-12-26 DIAGNOSIS — Z87828 Personal history of other (healed) physical injury and trauma: Secondary | ICD-10-CM | POA: Insufficient documentation

## 2014-12-26 DIAGNOSIS — Z720 Tobacco use: Secondary | ICD-10-CM | POA: Insufficient documentation

## 2014-12-26 DIAGNOSIS — L089 Local infection of the skin and subcutaneous tissue, unspecified: Secondary | ICD-10-CM | POA: Diagnosis not present

## 2014-12-26 DIAGNOSIS — I889 Nonspecific lymphadenitis, unspecified: Secondary | ICD-10-CM | POA: Diagnosis not present

## 2014-12-26 NOTE — ED Notes (Signed)
Pt reports to the ED for eval of left great toe infection. He reports that it started out as a small fungal infection of the toenail but it has progessively gotten worse. It appears erythematous and swollen. Increased warmth, tenderness, and pain also noted. Pt is not diabetic. CMS intact. Pt A&Ox4, resp e/u, and skin warm and dry.

## 2014-12-27 ENCOUNTER — Emergency Department (HOSPITAL_COMMUNITY)
Admission: EM | Admit: 2014-12-27 | Discharge: 2014-12-27 | Disposition: A | Discharge status: Home / Self Care | Payer: 59 | Attending: Emergency Medicine | Admitting: Emergency Medicine

## 2014-12-27 DIAGNOSIS — I889 Nonspecific lymphadenitis, unspecified: Secondary | ICD-10-CM

## 2014-12-27 DIAGNOSIS — L089 Local infection of the skin and subcutaneous tissue, unspecified: Secondary | ICD-10-CM

## 2014-12-27 LAB — CBC WITH DIFFERENTIAL/PLATELET
BASOS ABS: 0 10*3/uL (ref 0.0–0.1)
Basophils Relative: 1 % (ref 0–1)
Eosinophils Absolute: 0.4 10*3/uL (ref 0.0–0.7)
Eosinophils Relative: 5 % (ref 0–5)
HEMATOCRIT: 39.3 % (ref 39.0–52.0)
Hemoglobin: 13.5 g/dL (ref 13.0–17.0)
LYMPHS ABS: 2.4 10*3/uL (ref 0.7–4.0)
LYMPHS PCT: 29 % (ref 12–46)
MCH: 31.5 pg (ref 26.0–34.0)
MCHC: 34.4 g/dL (ref 30.0–36.0)
MCV: 91.6 fL (ref 78.0–100.0)
MONO ABS: 0.8 10*3/uL (ref 0.1–1.0)
MONOS PCT: 10 % (ref 3–12)
Neutro Abs: 4.6 10*3/uL (ref 1.7–7.7)
Neutrophils Relative %: 55 % (ref 43–77)
Platelets: 219 10*3/uL (ref 150–400)
RBC: 4.29 MIL/uL (ref 4.22–5.81)
RDW: 13.8 % (ref 11.5–15.5)
WBC: 8.2 10*3/uL (ref 4.0–10.5)

## 2014-12-27 LAB — BASIC METABOLIC PANEL
Anion gap: 7 (ref 5–15)
BUN: 10 mg/dL (ref 6–20)
CHLORIDE: 102 mmol/L (ref 101–111)
CO2: 27 mmol/L (ref 22–32)
Calcium: 9.1 mg/dL (ref 8.9–10.3)
Creatinine, Ser: 1.06 mg/dL (ref 0.61–1.24)
GLUCOSE: 107 mg/dL — AB (ref 65–99)
POTASSIUM: 4.1 mmol/L (ref 3.5–5.1)
SODIUM: 136 mmol/L (ref 135–145)

## 2014-12-27 LAB — SEDIMENTATION RATE: Sed Rate: 2 mm/hr (ref 0–16)

## 2014-12-27 LAB — CBG MONITORING, ED: Glucose-Capillary: 119 mg/dL — ABNORMAL HIGH (ref 65–99)

## 2014-12-27 MED ORDER — HYDROCODONE-ACETAMINOPHEN 5-325 MG PO TABS: 1.0000 | ORAL_TABLET | Freq: Four times a day (QID) | ORAL | Status: DC | PRN

## 2014-12-27 MED ORDER — VANCOMYCIN HCL IN DEXTROSE 1-5 GM/200ML-% IV SOLN
1000.0000 mg | Freq: Once | INTRAVENOUS | Status: AC
  Administered 2014-12-27: 1000 mg via INTRAVENOUS
  Filled 2014-12-27: qty 200

## 2014-12-27 MED ORDER — HYDROCODONE-ACETAMINOPHEN 5-325 MG PO TABS
1.0000 | ORAL_TABLET | Freq: Once | ORAL | Status: AC
  Administered 2014-12-27: 1 via ORAL
  Filled 2014-12-27: qty 1

## 2014-12-27 MED ORDER — PIPERACILLIN-TAZOBACTAM 3.375 G IVPB 30 MIN
3.3750 g | Freq: Once | INTRAVENOUS | Status: AC
  Administered 2014-12-27: 3.375 g via INTRAVENOUS
  Filled 2014-12-27: qty 50

## 2014-12-27 MED ORDER — SULFAMETHOXAZOLE-TRIMETHOPRIM 800-160 MG PO TABS: 1.0000 | ORAL_TABLET | Freq: Two times a day (BID) | ORAL | Status: DC

## 2014-12-27 MED ORDER — SODIUM CHLORIDE 0.9 % IV SOLN
INTRAVENOUS | Status: DC
  Administered 2014-12-27: 02:00:00 via INTRAVENOUS

## 2014-12-27 NOTE — ED Notes (Signed)
Pt verbalized understanding of d/c instructions and has no further questions.  

## 2014-12-27 NOTE — Discharge Instructions (Signed)
Soak your toe in warm epsom salts. Elevate your foot. Take the antibiotics until gone. Monitor your temperature. Return to the ED if you get a fever over 101, the swelling or pain get worse or you seem worse in any way. Call either the podiatrist or the orthopedist to recheck your toe soon.

## 2014-12-27 NOTE — ED Provider Notes (Signed)
CSN: [id number]     Arrival date & time 12/26/14  2234 History  This chart was scribed for Rolland Porter, MD by Randa Evens, ED Scribe. This patient was seen in room A10C/A10C and the patient's care was started at 1:31 AM.     Chief Complaint  Patient presents with  . Foot Pain   Patient is a 49 y.o. male presenting with lower extremity pain. The history is provided by the patient. No language interpreter was used.  Foot Pain   HPI Comments: Jerico Grisso is a 49 y.o. male who presents to the Emergency Department complaining of left great toe pain onset 3 days prior. Pt states that he has associated swelling. Pt states that pain is worse when walking. Pt states that pain is sharp and shooting up into his leg. Pt states that the toe is infected. Pt states that he soaked the foot in epsom salt and had some associated drainage. 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 Pt states that he is on his feet through out the day but states that he wears comfortable shoes. Pt does report recently driving up to [employer] wearing dress shoes and states that's when he first noticed his pain. Denies fever or chills. Pt states that he smokes 1 pack per day. He denies being diabetic.   PCP Dr Garnette Gunner   Past Medical History  Diagnosis Date  . Achilles tendon rupture 05/29/2013   History reviewed. No pertinent past surgical history. Family History  Problem Relation Age of Onset  . Cancer Mother   . Diabetes Mother   . Hypertension Mother   . Cancer Father   . Hypertension Father    History  Substance Use Topics  . Smoking status: Current Every Day Smoker -- 1.00 packs/day    Types: Cigarettes  . Smokeless tobacco: Never Used     Comment: 20 years smoking  . 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  Musculoskeletal: Positive for joint swelling and arthralgias.  All other systems reviewed and are  negative.     Allergies  Review of patient's allergies indicates no known allergies.  Home Medications   Prior to Admission medications   Medication Sig Start Date End Date Taking? Authorizing Provider  HYDROcodone-acetaminophen (NORCO/VICODIN) 5-325 MG per tablet Take 1 tablet by mouth every 6 (six) hours as needed for moderate pain. 12/27/14   Rolland Porter, MD  predniSONE (DELTASONE) 10 MG tablet Take 3 tabs on days 1-2, 2 tabs days 3-4, 1 tabs days 5-6 Patient not taking: Reported on 12/27/2014 01/01/14   Jearld Fenton, NP  sulfamethoxazole-trimethoprim (BACTRIM DS,SEPTRA DS) 800-160 MG per tablet Take 1 tablet by mouth 2 (two) times daily. 12/27/14   Rolland Porter, MD   BP 130/86 mmHg  Pulse 84  Temp(Src) 98.3 F (36.8 C) (Oral)  Resp 16  Ht 6\' 2"  (1.88 m)  Wt 190 lb (86.183 kg)  BMI 24.38 kg/m2  SpO2 98%  Vital signs normal     Physical Exam  Constitutional: He is oriented to person, place, and time. He appears well-developed and well-nourished.  Non-toxic appearance. He does not appear ill. No distress.  HENT:  Head: Normocephalic and atraumatic.  Right Ear: External ear normal.  Left Ear: External ear normal.  Nose: Nose normal. No mucosal edema or rhinorrhea.  Mouth/Throat: Oropharynx is clear and moist and mucous membranes are normal.  No dental abscesses or uvula swelling.  Eyes: Conjunctivae and EOM are normal. Pupils are equal, round, and reactive to light.  Neck: Normal range of motion and full passive range of motion without pain. Neck supple.  Cardiovascular: Normal rate, regular rhythm and normal heart sounds.  Exam reveals no gallop and no friction rub.   No murmur heard. Pulmonary/Chest: Effort normal and breath sounds normal. No respiratory distress. He has no wheezes. He has no rhonchi. He has no rales. He exhibits no tenderness and no crepitus.  Abdominal: Soft. Normal appearance and bowel sounds are normal. He exhibits no distension. There is no tenderness. There  is no rebound and no guarding.  Musculoskeletal: Normal range of motion. He exhibits no edema or tenderness.  Diffuse swelling of left great toe with a lot of redness around cuticle with clear drainage and red streak that goes to mid lower extremity, Moves all extremities well.   Neurological: He is alert and oriented to person, place, and time. He has normal strength. No cranial nerve deficit.  Skin: Skin is warm, dry and intact. No rash noted. No erythema. No pallor.  Red streak that goes to mid lower extremity.  Psychiatric: He has a normal mood and affect. His speech is normal and behavior is normal. His mood appears not anxious.  Nursing note and vitals reviewed.      ED Course  Procedures (including critical care time)  Medications  0.9 %  sodium chloride infusion ( Intravenous Stopped 12/27/14 0520)  vancomycin (VANCOCIN) IVPB 1000 mg/200 mL premix (0 mg Intravenous Stopped 12/27/14 0308)  piperacillin-tazobactam (ZOSYN) IVPB 3.375 g (0 g Intravenous Stopped 12/27/14 0238)    DIAGNOSTIC STUDIES: Oxygen Saturation is 99% on RA, nromal by my interpretation.    COORDINATION OF CARE: 1:35 AM-Discussed treatment plan with pt at bedside and pt agreed to plan.   Patient was given IV antibioticss. He was discussed with the hospitalist, who felt with no fever, no elevation of the white count, and normal sedimentation rate he could be treated as an outpatient.  Labs Review BASIC metabolic panel is normal except for mildly elevated glucose at 107  CBC is normal with white blood cell count 8.2  Sedimentation rate is normal at 2   Imaging Review Dg Foot Complete Left  12/26/2014   CLINICAL DATA:  49 year old male with pain and swelling in the left great toe  EXAM: LEFT FOOT - COMPLETE 3+ VIEW  COMPARISON:  None.  FINDINGS: There is no evidence of fracture or dislocation. There is no evidence of arthropathy or other focal bone abnormality. Soft tissues are unremarkable.  IMPRESSION: No  acute findings.   Electronically Signed   By: Anner Crete M.D.   On: 12/26/2014 23:40     EKG Interpretation None      MDM   Final diagnoses:  Infection of toe  Lymphadenitis   New Prescriptions   HYDROCODONE-ACETAMINOPHEN (NORCO/VICODIN) 5-325 MG PER TABLET    Take 1 tablet by mouth every 6 (six) hours as needed for moderate pain.   SULFAMETHOXAZOLE-TRIMETHOPRIM (BACTRIM DS,SEPTRA DS) 800-160 MG PER TABLET    Take 1 tablet by mouth 2 (two) times daily.     Plan discharge  Rolland Porter, MD, FACEP   I personally performed the services described in this documentation, which was scribed in my presence. The recorded information has been reviewed and considered.  Rolland Porter, MD, Barbette Or, MD 12/27/14 [phone]

## 2014-12-31 LAB — WOUND CULTURE: Gram Stain: NONE SEEN

## 2015-01-01 ENCOUNTER — Telehealth: Payer: Self-pay | Admitting: Emergency Medicine

## 2015-01-01 NOTE — Telephone Encounter (Signed)
Post ED Visit - Positive Culture Follow-up  Culture report reviewed by antimicrobial stewardship pharmacist: []  Wes Kusilvak, Pharm.D., BCPS []  Heide Guile, Pharm.D., BCPS []  Alycia Rossetti, Pharm.D., BCPS []  Foreman, [place].D., BCPS, AAHIVP []  Legrand Como, Pharm.D., BCPS, AAHIVP [x]  Levester Fresh, Pharm.D., BCPS  Positive Wound culture Treated with sulfa-trimeth, organism sensitive to the same and no further patient follow-up is required at this time.  Ernesta Amble 01/01/2015, 9:37 AM

## 2015-03-27 ENCOUNTER — Encounter: Payer: Self-pay | Admitting: Internal Medicine

## 2015-03-27 ENCOUNTER — Ambulatory Visit (INDEPENDENT_AMBULATORY_CARE_PROVIDER_SITE_OTHER): Payer: 59 | Admitting: Internal Medicine

## 2015-03-27 VITALS — BP 136/92 | HR 75 | Temp 98.3°F | Wt 188.0 lb

## 2015-03-27 DIAGNOSIS — R21 Rash and other nonspecific skin eruption: Secondary | ICD-10-CM | POA: Diagnosis not present

## 2015-03-27 MED ORDER — FLUOCINONIDE-E 0.05 % EX CREA: 1.0000 "application " | TOPICAL_CREAM | Freq: Two times a day (BID) | CUTANEOUS | Status: DC

## 2015-03-27 NOTE — Progress Notes (Signed)
Subjective:    Patient ID: Cameron Howell, male    DOB: 10/03/1965, 49 y.o.   MRN: [id number]  HPI  Pt presents to the clinic today with c/o a rash on his hands and his feet. He noticed this about 1 month ago. He reports they lesions start out as warts like, then the get filled with fluid and then eventually scab over. The lesions do itch. He reports he gets this about 1 x year for the last 10 years. He has tried Tinactin, and Hydrocortisone cream without any relief. He has not noticed any lesions in his mouth. He denies changes in soaps, lotions or detergents. He has never seen a dermatologist for this.  Review of Systems      Past Medical History  Diagnosis Date  . Achilles tendon rupture 05/29/2013    Current Outpatient Prescriptions  Medication Sig Dispense Refill  . fluocinonide-emollient (LIDEX-E) 0.05 % cream Apply 1 application topically 2 (two) times daily. 30 g 0   No current facility-administered medications for this visit.    No Known Allergies  Family History  Problem Relation Age of Onset  . Cancer Mother   . Diabetes Mother   . Hypertension Mother   . Cancer Father   . Hypertension Father     Social History   Social History  . Marital Status: Married    Spouse Name: N/A  . Number of Children: N/A  . Years of Education: N/A   Occupational History  . Not on file.   Social History Main Topics  . Smoking status: Current Every Day Smoker -- 1.00 packs/day    Types: Cigarettes  . Smokeless tobacco: Never Used     Comment: 20 years smoking  . Alcohol Use: No  . Drug Use: No  . Sexual Activity: Yes   Other Topics Concern  . Not on file   Social History Narrative     Constitutional: Denies fever, malaise, fatigue, headache or abrupt weight changes.  HEENT: Denies eye pain, eye redness, ear pain, ringing in the ears, wax buildup, runny nose, nasal congestion, bloody nose, or sore throat. Respiratory: Denies difficulty breathing, shortness of breath,  cough or sputum production.   Cardiovascular: Denies chest pain, chest tightness, palpitations or swelling in the hands or feet.  Skin: Pt reports rash. Denies redness, or ulcercations.    No other specific complaints in a complete review of systems (except as listed in HPI above).  Objective:   Physical Exam   BP 136/92 mmHg  Pulse 75  Temp(Src) 98.3 F (36.8 C) (Oral)  Wt 188 lb (85.276 kg)  SpO2 98% Wt Readings from Last 3 Encounters:  03/27/15 188 lb (85.276 kg)  12/26/14 190 lb (86.183 kg)  12/31/13 184 lb (83.462 kg)    General: Appears his stated age,  in NAD. Skin: Scattered scaly pustular lesions noted on dorsal surface of hands and plantar surface of feet. Some lesions are scabbed over. HEENT: Throat/Mouth: Teeth present, mucosa pink and moist, no exudate, lesions or ulcerations noted.   Cardiovascular: Normal rate and rhythm. S1,S2 noted.   Pulmonary/Chest: Normal effort and positive vesicular breath sounds. No respiratory distress. No wheezes, rales or ronchi noted.   BMET    Component Value Date/Time   NA 136 12/27/2014 0200   K 4.1 12/27/2014 0200   CL 102 12/27/2014 0200   CO2 27 12/27/2014 0200   GLUCOSE 107* 12/27/2014 0200   BUN 10 12/27/2014 0200   CREATININE 1.06 12/27/2014 0200  CALCIUM 9.1 12/27/2014 0200   GFRNONAA >60 12/27/2014 0200   GFRAA >60 12/27/2014 0200    Lipid Panel     Component Value Date/Time   CHOL 161 05/24/2013 1030   TRIG 102.0 05/24/2013 1030   HDL 26.30* 05/24/2013 1030   CHOLHDL 6 05/24/2013 1030   VLDL 20.4 05/24/2013 1030   LDLCALC 114* 05/24/2013 1030    CBC    Component Value Date/Time   WBC 8.2 12/27/2014 0200   RBC 4.29 12/27/2014 0200   HGB 13.5 12/27/2014 0200   HCT 39.3 12/27/2014 0200   PLT 219 12/27/2014 0200   MCV 91.6 12/27/2014 0200   MCH 31.5 12/27/2014 0200   MCHC 34.4 12/27/2014 0200   RDW 13.8 12/27/2014 0200   LYMPHSABS 2.4 12/27/2014 0200   MONOABS 0.8 12/27/2014 0200   EOSABS 0.4  12/27/2014 0200   BASOSABS 0.0 12/27/2014 0200    Hgb A1C Lab Results  Component Value Date   HGBA1C 6.1 05/24/2013        Assessment & Plan:   Rash:  Does not appear to be hand, foot and mouth eRx for Lidex cream- use as directed Referral placed to dermatology for further evaluation  Return precautions given, make an appt for your annual exam

## 2015-03-27 NOTE — Progress Notes (Signed)
Pre visit review using our clinic review tool, if applicable. No additional management support is needed unless otherwise documented below in the visit note. 

## 2015-03-27 NOTE — Patient Instructions (Signed)

## 2015-03-27 NOTE — Progress Notes (Signed)
   Subjective:    Patient ID: Cameron Howell, male    DOB: 1965/10/15, 49 y.o.   MRN: [id number]  HPI Mr. Cameron Howell is a 49 year old male who presents today with chief complaint of pustule like nodules on his knuckles and the bottom of his left foot.  Symptoms began one month ago but he says this is a recurrent problem for him. He says when these pop up they itch and he can squeeze them and pus comes out.     Review of Systems  Constitutional: Negative for fever, chills and fatigue.  HENT: Negative.   Respiratory: Negative for cough, shortness of breath and wheezing.   Cardiovascular: Negative for chest pain, palpitations and leg swelling.  Gastrointestinal: Negative.   Genitourinary: Negative.   Skin:       Dry, crusted lesions on hands and one foot.    Family History  Problem Relation Age of Onset  . Cancer Mother   . Diabetes Mother   . Hypertension Mother   . Cancer Father   . Hypertension Father    No current outpatient prescriptions on file prior to visit.   No current facility-administered medications on file prior to visit.       Objective:   Physical Exam  Constitutional: He is oriented to person, place, and time. He appears well-developed and well-nourished.  HENT:  Head: Normocephalic and atraumatic.  Mouth/Throat: Oropharynx is clear and moist. No oropharyngeal exudate.  Eyes: Conjunctivae are normal. Pupils are equal, round, and reactive to light.  Neck: Normal range of motion. Neck supple.  Cardiovascular: Normal rate, regular rhythm, normal heart sounds and intact distal pulses.   No murmur heard. Pulmonary/Chest: Effort normal and breath sounds normal.  Abdominal: Soft. Bowel sounds are normal.  Musculoskeletal: Normal range of motion.  Neurological: He is alert and oriented to person, place, and time.  Skin:  Scattered scaley pustules on right and left dorsum of hand on knuckles.  2 on the bottom of the left foot.       BP 136/92 mmHg  Pulse 75  Temp(Src)  98.3 F (36.8 C) (Oral)  Wt 188 lb (85.276 kg)  SpO2 98%      Assessment & Plan:  1. Skin lesions Will refer to dermatology given that this is a recurrent problem for him.  Doesn't present like hand, foot and mouth disease as he has been fever free, no lesions in mouth and this is recurrent for him.  Also, lesions appear dry except for one on left hand.   Rx for lidex cream.

## 2015-06-03 ENCOUNTER — Other Ambulatory Visit: Payer: Self-pay

## 2015-06-03 DIAGNOSIS — R21 Rash and other nonspecific skin eruption: Secondary | ICD-10-CM

## 2015-06-03 MED ORDER — FLUOCINONIDE-E 0.05 % EX CREA: 1.0000 "application " | TOPICAL_CREAM | Freq: Two times a day (BID) | CUTANEOUS | Status: DC

## 2015-06-03 NOTE — Telephone Encounter (Signed)
Pt request refill lidex E cream to [facility]; pt said area has cleared up except for 2 spots; pt did go for dermatology referral but was advised to continue cream that Avie Echevaria NP prescribed. Pt last seen and rx done 30 g on 03/27/15.Please advise.

## 2015-07-08 ENCOUNTER — Telehealth: Payer: Self-pay | Admitting: Internal Medicine

## 2015-07-08 ENCOUNTER — Encounter: Payer: 59 | Admitting: Internal Medicine

## 2015-07-08 NOTE — Telephone Encounter (Signed)
Pt did not come in for their appt today for cpe. Please let me know if pt needs to be contacted immediately for follow up or no follow up needed. Best phone number to contact pt is [phone].

## 2015-07-08 NOTE — Telephone Encounter (Signed)
Yes, he needs to make an appt for his physical

## 2015-07-09 NOTE — Telephone Encounter (Signed)
Pt aware, appt scheduled for 08/27/15.

## 2015-08-27 ENCOUNTER — Other Ambulatory Visit: Payer: Self-pay | Admitting: Internal Medicine

## 2015-08-27 ENCOUNTER — Encounter: Payer: Self-pay | Admitting: Internal Medicine

## 2015-08-27 ENCOUNTER — Ambulatory Visit (INDEPENDENT_AMBULATORY_CARE_PROVIDER_SITE_OTHER): Payer: 59 | Admitting: Internal Medicine

## 2015-08-27 VITALS — BP 124/82 | HR 70 | Temp 98.0°F | Ht 72.5 in | Wt 189.0 lb

## 2015-08-27 DIAGNOSIS — N529 Male erectile dysfunction, unspecified: Secondary | ICD-10-CM

## 2015-08-27 DIAGNOSIS — Z0000 Encounter for general adult medical examination without abnormal findings: Secondary | ICD-10-CM | POA: Diagnosis not present

## 2015-08-27 DIAGNOSIS — Z0001 Encounter for general adult medical examination with abnormal findings: Secondary | ICD-10-CM

## 2015-08-27 MED ORDER — FLUOCINONIDE-E 0.05 % EX CREA: 1.0000 "application " | TOPICAL_CREAM | Freq: Two times a day (BID) | CUTANEOUS | Status: DC

## 2015-08-27 MED ORDER — TADALAFIL 20 MG PO TABS: 10.0000 mg | ORAL_TABLET | ORAL | Status: DC | PRN

## 2015-08-27 NOTE — Progress Notes (Signed)
HPI  Pt presents to the clinic today for his annual exam.  Flu: never Tetanus: he thinks it has been close to 10 years Colon Screening: never Eye Doctor: as needed Dentist: as needed  Diet: He does eat meat. He consumes fruits and veggies a few times per week. He consumes some fried foods. He drinks mostly water, juice and soda. Exercise: None  Past Medical History  Diagnosis Date  . Achilles tendon rupture 05/29/2013    Current Outpatient Prescriptions  Medication Sig Dispense Refill  . fluocinonide-emollient (LIDEX-E) 0.05 % cream Apply 1 application topically 2 (two) times daily.     No current facility-administered medications for this visit.    No Known Allergies  Family History  Problem Relation Age of Onset  . Cancer Mother   . Diabetes Mother   . Hypertension Mother   . Cancer Father   . Hypertension Father     Social History   Social History  . Marital Status: Married    Spouse Name: N/A  . Number of Children: N/A  . Years of Education: N/A   Occupational History  . Not on file.   Social History Main Topics  . Smoking status: Current Every Day Smoker -- 1.00 packs/day    Types: Cigarettes  . Smokeless tobacco: Never Used     Comment: 20 years smoking  . Alcohol Use: No  . Drug Use: No  . Sexual Activity: Yes   Other Topics Concern  . Not on file   Social History Narrative    ROS:  Constitutional: Denies fever, malaise, fatigue, headache or abrupt weight changes.  HEENT: Denies eye pain, eye redness, ear pain, ringing in the ears, wax buildup, runny nose, nasal congestion, bloody nose, or sore throat. Respiratory: Denies difficulty breathing, shortness of breath, cough or sputum production.   Cardiovascular: Denies chest pain, chest tightness, palpitations or swelling in the hands or feet.  Gastrointestinal: Denies abdominal pain, bloating, constipation, diarrhea or blood in the stool.  GU: He does have difficulty maintaining an erection at  times. Denies frequency, urgency, pain with urination, blood in urine, odor or discharge. Musculoskeletal: Denies difficulty with gait, muscle pain or joint pain and swelling.  Skin: Denies redness, rashes, lesions or ulcercations.  Neurological: Denies dizziness, difficulty with memory, difficulty with speech or problems with balance and coordination.   No other specific complaints in a complete review of systems (except as listed in HPI above).  PE:  BP 124/82 mmHg  Pulse 70  Temp(Src) 98 F (36.7 C) (Oral)  Ht 6' 0.5" (1.842 m)  Wt 189 lb (85.73 kg)  BMI 25.27 kg/m2  SpO2 99% Wt Readings from Last 3 Encounters:  08/27/15 189 lb (85.73 kg)  03/27/15 188 lb (85.276 kg)  12/26/14 190 lb (86.183 kg)    General: Appears his stated age, well developed, well nourished in NAD. HEENT: Head: normal shape and size; Eyes: sclera white, no icterus, conjunctiva pink, PERRLA and EOMs intact; Ears: Tm's gray and intact, normal light reflex; Throat/Mouth: Teeth present, mucosa pink and moist, no lesions or ulcerations noted.  Neck:  Neck supple, trachea midline. No massses, lumps or thyromegaly present.  Cardiovascular: Normal rate and rhythm. S1,S2 noted.  No murmur, rubs or gallops noted. No JVD or BLE edema. Pulmonary/Chest: Normal effort and positive vesicular breath sounds. No respiratory distress. No wheezes, rales or ronchi noted.  Abdomen: Soft and nontender. Normal bowel sounds. No distention or masses noted. Liver, spleen and kidneys non palpable. Musculoskeletal: Normal  range of motion. No signs of joint swelling. No difficulty with gait.  Neurological: Alert and oriented. Cranial nerves II-XII grossly intact. Coordination normal. +DTRs bilaterally. Psychiatric: Mood and affect normal. Behavior is normal. Judgment and thought content normal.    Assessment and Plan:  Preventative Health Maintenance:  Pt declines flu and tetanus vaccines He would like screening labs today including  for screening for STD's Encouraged him to consume a low fat diet and start an exercise program Encouraged him to see an eye doctor and dentist at least yearly He declines colonoscopy, but cologaurd ordered  Erectile dysfunction:  Will trial Cialis RTC in 1 year or sooner if needed

## 2015-08-27 NOTE — Patient Instructions (Signed)

## 2015-08-28 LAB — COMPREHENSIVE METABOLIC PANEL
ALT: 17 U/L (ref 0–53)
AST: 17 U/L (ref 0–37)
Albumin: 4.7 g/dL (ref 3.5–5.2)
Alkaline Phosphatase: 46 U/L (ref 39–117)
BILIRUBIN TOTAL: 0.5 mg/dL (ref 0.2–1.2)
BUN: 12 mg/dL (ref 6–23)
CALCIUM: 9.9 mg/dL (ref 8.4–10.5)
CHLORIDE: 101 meq/L (ref 96–112)
CO2: 32 meq/L (ref 19–32)
Creatinine, Ser: 0.87 mg/dL (ref 0.40–1.50)
GFR: 119.19 mL/min (ref >60.00)
Glucose, Bld: 91 mg/dL (ref 70–99)
Potassium: 4.2 mEq/L (ref 3.5–5.1)
Sodium: 139 mEq/L (ref 135–145)
Total Protein: 7.3 g/dL (ref 6.0–8.3)

## 2015-08-28 LAB — CBC
HEMATOCRIT: 44 % (ref 39.0–52.0)
HEMOGLOBIN: 14.7 g/dL (ref 13.0–17.0)
MCHC: 33.4 g/dL (ref 30.0–36.0)
MCV: 93.2 fl (ref 78.0–100.0)
PLATELETS: 220 10*3/uL (ref 150.0–400.0)
RBC: 4.73 Mil/uL (ref 4.22–5.81)
RDW: 13.9 % (ref 11.5–15.5)
WBC: 5.2 10*3/uL (ref 4.0–10.5)

## 2015-08-28 LAB — PSA: PSA: 0.96 ng/mL (ref 0.10–4.00)

## 2015-08-28 LAB — LIPID PANEL
CHOL/HDL RATIO: 7
Cholesterol: 197 mg/dL (ref 0–200)
HDL: 28.6 mg/dL — AB (ref >39.00)
LDL Cholesterol: 132 mg/dL — ABNORMAL HIGH (ref 0–99)
NONHDL: 168.55
Triglycerides: 182 mg/dL — ABNORMAL HIGH (ref 0.0–149.0)
VLDL: 36.4 mg/dL (ref 0.0–40.0)

## 2015-08-28 LAB — RPR

## 2015-08-28 LAB — HIV ANTIBODY (ROUTINE TESTING W REFLEX): HIV 1&2 Ab, 4th Generation: NONREACTIVE

## 2015-08-28 LAB — HEPATITIS C ANTIBODY: HCV Ab: NEGATIVE

## 2015-09-01 ENCOUNTER — Telehealth: Payer: Self-pay

## 2015-09-01 NOTE — Telephone Encounter (Signed)
Cologuard req. Form has been faxed along with demographics sheet and insurance card--pt is aware and brochure with CPT code to call insurance given to pt--instructions given to pt and he expressed understanding

## 2015-09-08 DIAGNOSIS — Z1212 Encounter for screening for malignant neoplasm of rectum: Secondary | ICD-10-CM | POA: Diagnosis not present

## 2015-09-08 DIAGNOSIS — Z1211 Encounter for screening for malignant neoplasm of colon: Secondary | ICD-10-CM | POA: Diagnosis not present

## 2015-09-08 LAB — COLOGUARD: COLOGUARD: NEGATIVE

## 2015-09-24 ENCOUNTER — Encounter: Payer: Self-pay | Admitting: Internal Medicine

## 2015-10-07 ENCOUNTER — Encounter: Payer: Self-pay | Admitting: Internal Medicine

## 2017-04-19 ENCOUNTER — Encounter: Payer: Self-pay | Admitting: Internal Medicine

## 2017-04-19 ENCOUNTER — Other Ambulatory Visit: Payer: Self-pay

## 2017-04-19 ENCOUNTER — Telehealth: Payer: 59 | Admitting: Family

## 2017-04-19 ENCOUNTER — Telehealth: Payer: Self-pay | Admitting: Internal Medicine

## 2017-04-19 DIAGNOSIS — R21 Rash and other nonspecific skin eruption: Secondary | ICD-10-CM

## 2017-04-19 MED ORDER — FLUOCINONIDE-E 0.05 % EX CREA: 1.0000 "application " | TOPICAL_CREAM | Freq: Two times a day (BID) | CUTANEOUS | 2 refills | Status: DC

## 2017-04-19 NOTE — Telephone Encounter (Signed)
Copied from [place] #[id number]. Topic: Quick Communication - See Telephone Encounter >> Apr 19, 2017  9:58 AM Arletha Grippe wrote: CRM for notification. See Telephone encounter for:   04/19/17. Pt needs refill for fluocinonide-emollient (LIDEX-E) 0.05 % cream pt uses [place], [place] - [address] Cb number is for pt [phone], give pt a call when filled Pt states current rx has expired

## 2017-04-19 NOTE — Progress Notes (Signed)
Based on what you shared with me it looks like you have a serious condition that should be evaluated in a face to face office visit.  NOTE: Even if you have entered your credit card information for this eVisit, you will not be charged.   If you are having a true medical emergency please call 911.  If you need an urgent face to face visit, [place] has four urgent care centers for your convenience.  If you need care fast and have a high deductible or no insurance consider:   [email or web address]  [phone]  [address], [address] [place], [place] [address] 8 am to 8 pm Monday-Friday 10 am to 4 pm Saturday-Sunday   The following sites will take your  insurance:    . [facility]  [phone] Get Driving Directions Find a Provider at this Location  [address] [place], [place] [address] . 10 am to 8 pm Monday-Friday . 12 pm to 8 pm Saturday-Sunday   . [facility]  [phone] Get Driving Directions Find a Provider at this Location  [address], [address] [place], [place] [address] . 8 am to 8 pm Monday-Friday . 9 am to 6 pm Saturday . 11 am to 6 pm Sunday   . [facility] [place]  [phone] Get Driving Directions  [address] [place], [place] [address] . 8 am to 8 pm Monday-Friday . 8 am to 4 pm Saturday-Sunday   Your e-visit answers were reviewed by a board certified advanced clinical practitioner to complete your personal care plan.  Thank you for using e-Visits.  

## 2017-07-13 ENCOUNTER — Telehealth (HOSPITAL_COMMUNITY): Payer: Self-pay | Admitting: Psychology

## 2017-07-17 ENCOUNTER — Telehealth (HOSPITAL_COMMUNITY): Payer: Self-pay | Admitting: Psychology

## 2017-07-18 ENCOUNTER — Ambulatory Visit (INDEPENDENT_AMBULATORY_CARE_PROVIDER_SITE_OTHER): Payer: 59 | Admitting: Psychology

## 2017-07-18 ENCOUNTER — Encounter (HOSPITAL_COMMUNITY): Payer: Self-pay | Admitting: Psychology

## 2017-07-18 DIAGNOSIS — F1420 Cocaine dependence, uncomplicated: Secondary | ICD-10-CM | POA: Diagnosis not present

## 2017-07-18 DIAGNOSIS — F1020 Alcohol dependence, uncomplicated: Secondary | ICD-10-CM

## 2017-07-18 NOTE — Progress Notes (Signed)
Comprehensive Clinical Assessment (CCA) Note  07/18/2017 Cameron Howell [id number]  Visit Diagnosis:  Cocaine dependence, episodic Alcohol use disorder, severe   CCA Part One  Part One has been completed on paper by the patient.  (See scanned document in Chart Review)  CCA Part Two A  Intake/Chief Complaint:  CCA Intake With Chief Complaint CCA Part Two Date: 07/18/17 CCA Part Two Time: 1000 Chief Complaint/Presenting Problem: Patient was drug-free for 12 years. Ten days ago he went on a one-day binge of cocaine and alcohol. He is afraid that he will get back into the addiction and wants to stop it now.  Patients Currently Reported Symptoms/Problems: Patient reports he is scared that he may get back  into an old pattern of using cocaine and alcohol and doesn't want to go back.  Collateral Involvement: patient's wife is encouraging him to get treatment.  Individual's Strengths: Motivated, long period of sobriety, self-employed, strong faith, family support Individual's Preferences: support for his recovery, to address his inner self and explore what has fed his drug use, more effective coping skills Individual's Abilities: has transportation, is articulate and willing to share Type of Services Patient Feels Are Needed: I need treatment.  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   Mental Health Symptoms Depression:  Depression: Change in energy/activity  Mania:  Mania: N/A  Anxiety:   Anxiety: N/A  Psychosis:  Psychosis: N/A  Trauma:  Trauma: N/A  Obsessions:  Obsessions: N/A  Compulsions:  Compulsions: N/A  Inattention:  Inattention: N/A  Hyperactivity/Impulsivity:  Hyperactivity/Impulsivity: N/A  Oppositional/Defiant Behaviors:  Oppositional/Defiant Behaviors: N/A  Borderline Personality:  Emotional Irregularity: N/A  Other Mood/Personality Symptoms:  Other Mood/Personality Symtpoms:  Patient denies having ever been diagnosed with depression or anxiety. the energy change is most likely related to coming off the drugs   Mental Status Exam Appearance and self-care  Stature:  Stature: Average  Weight:  Weight: Average weight  Clothing:  Clothing: Neat/clean  Grooming:  Grooming: Normal  Cosmetic use:  Cosmetic Use: Age appropriate  Posture/gait:  Posture/Gait: Normal  Motor activity:  Motor Activity: Not Remarkable  Sensorium  Attention:  Attention: Normal  Concentration:  Concentration: Normal  Orientation:  Orientation: X5  Recall/memory:  Recall/Memory: Normal  Affect and Mood  Affect:  Affect: Appropriate  Mood:     Relating  Eye contact:  Eye Contact: Normal  Facial expression:  Facial Expression: Responsive  Attitude toward examiner:  Attitude Toward Examiner: Cooperative  Thought and Language  Speech flow: Speech Flow: Normal  Thought content:  Thought Content: Appropriate to mood and circumstances  Preoccupation:     Hallucinations:     Organization:     Transport planner of Knowledge:  Fund of Knowledge: Average  Intelligence:  Intelligence: Average  Abstraction:  Abstraction: Normal  Judgement:  Judgement: Common-sensical  Reality Testing:  Reality Testing: Realistic  Insight:  Insight: Good  Decision Making:  Decision Making: Normal  Social Functioning  Social Maturity:  Social Maturity: Responsible  Social Judgement:  Social Judgement: "Games developer"  Stress  Stressors:  Stressors: Chiropodist, Work, Family conflict  Coping Ability:  Coping Ability: Normal  Skill Deficits:     Supports:      Family and Psychosocial History: Family history Marital status: Married Number of Years Married: 42 What types of issues is patient dealing with in the relationship?: His wife is very angry and upset about his recent relapse. Otherwise,  they have a good marriage.  Additional relationship information: Patient and wife moved from [place] down [place]  over five years ago to look after his father who was ill with cancer.  Are you sexually active?: Yes What is your sexual orientation?: Heterosexual Has your sexual activity been affected by drugs, alcohol, medication, or emotional stress?: No Does patient have children?: Yes How many children?: 1 How is patient's relationship with their children?: He has a good relationship with his 49 yo son, Cameron Howell, and two grandchildren. They also live in [place]  Childhood History:  Childhood History By whom was/is the patient raised?: Mother Additional childhood history information: Patient's parents split up when he was just [age] year old. He spent time with single-mother, but also spent a lot of time with his great grandparents, who lived in [place]. Description of patient's relationship with caregiver when they were a child: He had a good relationship with his mother, but his great grandparents were also active in raising him. He admits that he would have benefitted from having a strong male presence or role model in his life. Patient's description of current relationship with people who raised him/her: Everyone who raised him is deceased. Most recently, his father died two years ago from prostate cancer that had metastisized.  How were you disciplined when you got in trouble as a child/adolescent?: appropriately Does patient have siblings?: No Did patient suffer any verbal/emotional/physical/sexual abuse as a child?: No Did patient suffer from severe childhood neglect?: No Has patient ever been sexually abused/assaulted/raped as an adolescent or adult?: No Was the patient ever a victim of a crime or a disaster?: No Witnessed domestic violence?: No Has patient been effected by domestic violence as an adult?: No  CCA Part Two B  Employment/Work Situation: Employment / Work Situation Employment situation: Employed Where is patient currently employed?: Patient is self-employed. 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  How long has patient been employed?: self-employed - he is a convicted felon from [place] and many years ago. He stopped applying for traditional jobs because his record kept getting in the way Patient's job has been impacted by current illness: No What is the longest time patient has a held a job?: Two years of self-employment in the retail business. Where was the patient employed at that time?: current self-employed Research officer, trade union in [employer] Has patient ever been in the [employer]?: Yes (Describe in comment) Has patient ever served in combat?: No Did You Receive Any Psychiatric Treatment/Services While in the [employer]?: No Are There Guns or Other Weapons in [place]?: No Are These Weapons Safely Secured?: No  Education: Museum/gallery curator Currently Attending: N/A Last Grade Completed: 12 Name of [place]: [employer] in [place], [place] Did Teacher, adult education From [employer]?: Yes Did Physicist, medical?: No Did Heritage manager?: No Did You Have Any Special Interests In School?: I was a good athlete and played lots of sports Did You Have An Individualized Education Program (IIEP): No Did You Have Any Difficulty At School?: No  Religion: Religion/Spirituality Are You A Religious Person?: No How Might This Affect Treatment?: I am a spiritual person and very active in the [place].   Leisure/Recreation: Leisure / Recreation Leisure and Hobbies: I spend time with family, I tore my achilles and I cannot play ball any more. I was not able to have the necessary surgery to repair the damage.  Exercise/Diet: Exercise/Diet Do You Exercise?: No  Have You Gained or Lost A Significant Amount of Weight in the Past Six Months?: No Do You Follow a Special Diet?: No Do You Have Any Trouble Sleeping?: No  CCA Part Two C  Alcohol/Drug Use: Alcohol / Drug Use Pain Medications: N/A Prescriptions: N/A Over the Counter:  N/A History of alcohol / drug use?: Yes Longest period of sobriety (when/how long): I was clean for 12 years, but I relapsed 10 days ago and it scared me. I don't want to go back to that life. Negative Consequences of Use: Legal, Personal relationships, Financial Substance #1 Name of Substance 1: cocaine 1 - Age of First Use: 24 1 - Amount (size/oz): $200   1 - Frequency: Daily 1 - Duration: for years before I got clean for 12 years 1 - Last Use / Amount: 07/11/17 $150 Substance #2 Name of Substance 2: alcohol 2 - Age of First Use: 15 2 - Amount (size/oz): 10-12 beers 2 - Frequency: Daily  2 - Duration: years, but not for 12 years of total sobriety 2 - Last Use / Amount: 07/11/17  - 10 beers Substance #3 Name of Substance 3: marijuana 3 - Age of First Use: 15 3 - Amount (size/oz): couple hits 3 - Frequency: sporadically 3 - Duration: off and on 3 - Last Use / Amount: 2005. A couple of hits                CCA Part Three  ASAM's:  Six Dimensions of Multidimensional Assessment  Dimension 1:  Acute Intoxication and/or Withdrawal Potential:  Dimension 1:  Comments: Patient has not used in 7 days and has fully recovered from his binge.   Dimension 2:  Biomedical Conditions and Complications:  Dimension 2:  Comments: No physical issues or pain.  Dimension 3:  Emotional, Behavioral, or Cognitive Conditions and Complications:  Patient has poor coping skills and low frustration tolerance.   Dimension 4:  Readiness to Change:  Patient reports intention to change, but this will require major lifestyle change and it is likely he will buck this requirements  Dimension 5:  Relapse, Continued use, or Continued Problem Potential: Significant likelihood of relapse  Dimension 6:  Recovery/Living Environment: wife is supportive, but patient has many friends and contacts who use. He will be tempted when he is out working and in the world.   Substance use Disorder (SUD): Cocaine Dependence,  Episodic Alcohol use disorder, Severe    Social Function:  Social Functioning Social Maturity: Responsible Social Judgement: "Games developer"  Stress:  Stress Stressors: Chiropodist, Work, Family conflict Coping Ability: Normal Patient Takes Medications The Way The Doctor Instructed?: NA Priority Risk: Low Acuity  Risk Assessment- Self-Harm Potential: Risk Assessment For Self-Harm Potential Thoughts of Self-Harm: No current thoughts Method: No plan Availability of Means: No access/NA Additional Comments for Self-Harm Potential: person has no thoughts of hurting self nor has any history of self-harm.  Risk Assessment -Dangerous to Others Potential: Risk Assessment For Dangerous to Others Potential Method: No Plan Availability of Means: No access or NA Intent: Vague intent or NA Additional Comments for Danger to Others Potential: Patient is not a violent person and has not thoughts or hurting another person  DSM5 Diagnoses: Alcohol use Disorder F10.20 Cocaine Dependence Episodic F14.20  Patient Centered Plan: Patient is on the following Treatment Plan(s):  Enter the CD-IOP and begin to build a daily recovery plan  Recommendations for Services/Supports/Treatments: CD-IOP @ [facility]  Treatment Plan Summary:    Referrals to Alternative  Service(s): Referred to Alternative Service(s):   Place:   Date:   Time:    Referred to Alternative Service(s):   Place:   Date:   Time:    Referred to Alternative Service(s):   Place:   Date:   Time:    Referred to Alternative Service(s):   Place:   Date:   Time:     Brandon Melnick

## 2017-07-20 ENCOUNTER — Encounter (HOSPITAL_COMMUNITY): Payer: Self-pay

## 2017-07-20 ENCOUNTER — Encounter (HOSPITAL_COMMUNITY): Payer: Self-pay | Admitting: Psychology

## 2017-07-20 ENCOUNTER — Ambulatory Visit (INDEPENDENT_AMBULATORY_CARE_PROVIDER_SITE_OTHER): Payer: 59 | Admitting: Licensed Clinical Social Worker

## 2017-07-20 DIAGNOSIS — F1420 Cocaine dependence, uncomplicated: Secondary | ICD-10-CM | POA: Diagnosis not present

## 2017-07-20 DIAGNOSIS — F1020 Alcohol dependence, uncomplicated: Secondary | ICD-10-CM | POA: Diagnosis not present

## 2017-07-21 ENCOUNTER — Encounter (HOSPITAL_COMMUNITY): Payer: Self-pay

## 2017-07-22 ENCOUNTER — Encounter (HOSPITAL_COMMUNITY): Payer: Self-pay | Admitting: Licensed Clinical Social Worker

## 2017-07-22 NOTE — Progress Notes (Signed)
Cameron Howell is a 52 y.o. male patient. CD-IOP. The patient arrived early for his orientation. He had agreed to come in early and complete the orientation and then begin the CD-IOP program this afternoon. The patient apologized for being early, but explained that after much consideration, he does not believe his schedule with allow for the extensive hours of this program. 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 I assured him that I understood and had, in fact, suggested when we met on Tuesday, that he consider the weekly Aftercare program on Wednesday evenings because of his obligations elsewhere. Today, he agreed that the once weekly program would be best. I was able to locate the counselor for that program, WS, and he was able to meet the patient and share a little about group. Our brief meeting concluded with the understanding that this patient will return this evening and begin the Aftercare program. He will be deleted from the CD-IOP roster.         Brandon Melnick, LCAS

## 2017-07-22 NOTE — Progress Notes (Signed)
  Weekly Group Progress Note  Program: OUTPATIENT SKILLS GROUP  Group Time: 5:30-6:30pm  Participation Level: Active  Behavioral Response: Appropriate and Sharing  Type of Therapy:  Psycho-education Group  Skills discussed: CBT ABC Model   Summary of Progress: Pt was active and engaged in his first group. He shared that he was seeking help for substance addiction and has 12 yrs of sobriety but relapsed last week. Pt stated he will benefit from sharing and receiving feedback from other group members. Pt stated he felt like the color "grey" today since he was not too good or too bad. Pt identified his ABC as "being frustrated that others were disrespectful of his time by not showing up to a meeting".   Summary of Group: Pts were active and engaged in session. Counselor asked pts to state "what color they felt like today" to build self awareness and abstraction. 2 new group members were present and shared openly about their struggle w/ substance addiction. Counselor showed brief video describing ABC model and how it can help pts to minimize and challenge their negative beliefs about activating events.   Cameron Howell, LPCA, LCASA

## 2017-07-25 ENCOUNTER — Encounter (HOSPITAL_COMMUNITY): Payer: Self-pay

## 2017-07-27 ENCOUNTER — Ambulatory Visit (INDEPENDENT_AMBULATORY_CARE_PROVIDER_SITE_OTHER): Payer: 59 | Admitting: Licensed Clinical Social Worker

## 2017-07-27 ENCOUNTER — Encounter (HOSPITAL_COMMUNITY): Payer: Self-pay

## 2017-07-27 DIAGNOSIS — F1020 Alcohol dependence, uncomplicated: Secondary | ICD-10-CM | POA: Diagnosis not present

## 2017-07-27 DIAGNOSIS — F1420 Cocaine dependence, uncomplicated: Secondary | ICD-10-CM

## 2017-07-28 ENCOUNTER — Encounter (HOSPITAL_COMMUNITY): Payer: Self-pay

## 2017-07-29 ENCOUNTER — Encounter (HOSPITAL_COMMUNITY): Payer: Self-pay | Admitting: Licensed Clinical Social Worker

## 2017-07-29 NOTE — Progress Notes (Signed)
  Weekly Group Progress Note  Program: OUTPATIENT SKILLS GROUP  Group Time: 5:30-6:30pm  Participation Level: Active  Behavioral Response: Appropriate and Sharing  Type of Therapy:  Psycho-education Group  Skills discussed: Meditation, Concentration, Mindfulness    Summary of Progress: Pt was active and engaged for his second group. He stated he was a 7/10 and felt "not too bad or good" though he was stressed from Wednesday being "his busy day". Pt shared about his ongoing routine to help him stay sober including this group. [employer], and working hard. Pt stated he had trouble concentrating since he was "always thinking about what's next".  Summary of Group: Patients were active and engaged in psychoeducation skill building group designed to help sustain recovery from anxiety, depression, and substance addiction. Pts were asked to rate their current feeling state on a 1-10 scale. Pts were led in a mindfulness exercise to help build focus and concentration. Counselor discussed pt's experience of meditation and how it could be applied to their unique recoveries. Counselor asked about pt's experience w/ applying hw from last session.   Archie Balboa, LPCA, LCASA

## 2017-08-01 ENCOUNTER — Encounter (HOSPITAL_COMMUNITY): Payer: Self-pay

## 2017-08-03 ENCOUNTER — Encounter (HOSPITAL_COMMUNITY): Payer: Self-pay

## 2017-08-04 ENCOUNTER — Encounter (HOSPITAL_COMMUNITY): Payer: Self-pay

## 2017-08-08 ENCOUNTER — Encounter (HOSPITAL_COMMUNITY): Payer: Self-pay

## 2017-08-10 ENCOUNTER — Encounter (HOSPITAL_COMMUNITY): Payer: Self-pay

## 2017-08-10 ENCOUNTER — Ambulatory Visit (INDEPENDENT_AMBULATORY_CARE_PROVIDER_SITE_OTHER): Payer: 59 | Admitting: Licensed Clinical Social Worker

## 2017-08-10 DIAGNOSIS — F1020 Alcohol dependence, uncomplicated: Secondary | ICD-10-CM | POA: Diagnosis not present

## 2017-08-10 DIAGNOSIS — F1420 Cocaine dependence, uncomplicated: Secondary | ICD-10-CM | POA: Diagnosis not present

## 2017-08-11 ENCOUNTER — Encounter (HOSPITAL_COMMUNITY): Payer: Self-pay

## 2017-08-15 ENCOUNTER — Encounter (HOSPITAL_COMMUNITY): Payer: Self-pay | Admitting: Licensed Clinical Social Worker

## 2017-08-15 ENCOUNTER — Encounter (HOSPITAL_COMMUNITY): Payer: Self-pay

## 2017-08-15 NOTE — Progress Notes (Signed)
  Weekly Group Progress Note  Program: OUTPATIENT SKILLS GROUP  Group Time: 5:30-6:30pm  Participation Level: Active  Behavioral Response: Appropriate and Sharing  Type of Therapy:  Psycho-education Group  Skills discussed: Assertiveness, Communication   Summary of Progress: Pt was active and engaged in group session, provided meaningful feedback to another member who was struggling w/ isolation. 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  Summary of Group: Patients were active and engaged in group skill building session. Counselor encouraged pts to share about their individual goals and needs in order to increase effectiveness. Counselor discussed assertive communication and saying "no". 2 new members were present after recently completing CD-IOP at this office.    Archie Balboa, LPCA, LCASA

## 2017-08-17 ENCOUNTER — Ambulatory Visit (HOSPITAL_COMMUNITY): Payer: Self-pay | Admitting: Licensed Clinical Social Worker

## 2017-08-17 ENCOUNTER — Encounter (HOSPITAL_COMMUNITY): Payer: Self-pay

## 2017-08-18 ENCOUNTER — Encounter (HOSPITAL_COMMUNITY): Payer: Self-pay

## 2017-08-22 ENCOUNTER — Encounter (HOSPITAL_COMMUNITY): Payer: Self-pay

## 2018-05-18 DIAGNOSIS — H524 Presbyopia: Secondary | ICD-10-CM | POA: Diagnosis not present

## 2018-08-20 ENCOUNTER — Ambulatory Visit (HOSPITAL_COMMUNITY)
Admission: EM | Admit: 2018-08-20 | Discharge: 2018-08-20 | Disposition: A | Discharge status: Home / Self Care | Payer: 59 | Attending: Internal Medicine | Admitting: Internal Medicine

## 2018-08-20 ENCOUNTER — Ambulatory Visit (INDEPENDENT_AMBULATORY_CARE_PROVIDER_SITE_OTHER): Payer: 59

## 2018-08-20 ENCOUNTER — Encounter (HOSPITAL_COMMUNITY): Payer: Self-pay | Admitting: Emergency Medicine

## 2018-08-20 DIAGNOSIS — S91302A Unspecified open wound, left foot, initial encounter: Secondary | ICD-10-CM

## 2018-08-20 DIAGNOSIS — M79672 Pain in left foot: Secondary | ICD-10-CM

## 2018-08-20 DIAGNOSIS — Z5189 Encounter for other specified aftercare: Secondary | ICD-10-CM | POA: Diagnosis not present

## 2018-08-20 MED ORDER — DOXYCYCLINE HYCLATE 100 MG PO CAPS: 100.0000 mg | ORAL_CAPSULE | Freq: Two times a day (BID) | ORAL | 0 refills | Status: DC

## 2018-08-20 NOTE — ED Provider Notes (Signed)
[facility]    CSN: [id number] Arrival date & time: 08/20/18  1518     History   Chief Complaint Chief Complaint  Patient presents with  . Wound Check    HPI Cameron Howell is a 53 y.o. male.   Cameron Howell presents with family with complaints of increased foot pain surrounding wounds. Has had chronic wounds to the foot, typically to webbing of toes. Uses lidex cream to the areas which helps. Over the past two months has had increased pain and persistence of two wounds which are larger than usual for him. More pain with irritation from his shoe. Has some clear/milky drainage which has an odor. Has been present for approximately 2 months but has increased in pain. Has not seen a podiatrist. No specific injury to the areas prior to onset. Start out as "little bumps" before further eruption. He is not diabetic. No known MRSA history.     ROS per HPI, negative if not otherwise mentioned.      Past Medical History:  Diagnosis Date  . Achilles tendon rupture 05/29/2013    Patient Active Problem List   Diagnosis Date Noted  . Cocaine dependence, episodic ([facility]) 07/18/2017  . Alcohol use disorder, severe, dependence ([facility]) 07/18/2017  . Tobacco abuse 05/29/2013    History reviewed. No pertinent surgical history.     Home Medications    Prior to Admission medications   Medication Sig Start Date End Date Taking? Authorizing Provider  doxycycline (VIBRAMYCIN) 100 MG capsule Take 1 capsule (100 mg total) by mouth 2 (two) times daily. 08/20/18   Zigmund Gottron, NP  fluocinonide-emollient (LIDEX-E) 0.05 % cream Apply 1 application 2 (two) times daily topically. 04/19/17   Jearld Fenton, NP  tadalafil (CIALIS) 20 MG tablet Take 0.5-1 tablets (10-20 mg total) by mouth every other day as needed for erectile dysfunction. 08/27/15   Jearld Fenton, NP    Family History Family History  Problem Relation Age of Onset  . Cancer Mother   . Diabetes Mother   . Hypertension Mother   .  Cancer Father   . Hypertension Father     Social History Social History   Tobacco Use  . Smoking status: Current Every Day Smoker    Packs/day: 1.00    Types: Cigarettes  . Smokeless tobacco: Never Used  . Tobacco comment: 20 years smoking  Substance Use Topics  . Alcohol use: No    Alcohol/week: 0.0 standard drinks  . Drug use: No     Allergies   Patient has no known allergies.   Review of Systems Review of Systems   Physical Exam Triage Vital Signs ED Triage Vitals  Enc Vitals Group     BP 08/20/18 1601 121/75     Pulse Rate 08/20/18 1601 87     Resp 08/20/18 1601 18     Temp 08/20/18 1601 97.8 F (36.6 C)     Temp Source 08/20/18 1601 Temporal     SpO2 08/20/18 1601 99 %     Weight --      Height --      Head Circumference --      Peak Flow --      Pain Score 08/20/18 1602 7     Pain Loc --      Pain Edu? --      Excl. in [place]? --    No data found.  Updated Vital Signs BP 121/75 (BP Location: Left Arm)   Pulse 87  Temp 97.8 F (36.6 C) (Temporal)   Resp 18   SpO2 99%    Physical Exam Constitutional:      Appearance: He is well-developed.  Cardiovascular:     Rate and Rhythm: Normal rate and regular rhythm.     Pulses:          Dorsalis pedis pulses are 2+ on the left side.  Pulmonary:     Effort: Pulmonary effort is normal.     Breath sounds: Normal breath sounds.  Feet:     Comments: cap refill < 2 seconds ; see photos of wounds; dry but with fissures; wounds to lateral left foot at base of pinky toe and lateral heel; no active drainage; no palpable fluctuance; tender; some redness to skin Skin:    General: Skin is warm and dry.  Neurological:     Mental Status: He is alert and oriented to person, place, and time.            UC Treatments / Results  Labs (all labs ordered are listed, but only abnormal results are displayed) Labs Reviewed - No data to display  EKG None  Radiology Dg Foot Complete Left  Result Date:  08/20/2018 CLINICAL DATA:  Reoccurring wound involving the plantar surface of foot. Evaluate for osteomyelitis. EXAM: LEFT FOOT - COMPLETE 3+ VIEW COMPARISON:  12/26/2014 FINDINGS: No fracture or dislocation. Joint spaces appear preserved. No erosions. No significant hallux valgus deformity. Note is made of a small os peroneus and os tibialis externum. Distal vascular calcifications. Regional soft tissues appear otherwise normal. No subcutaneous emphysema. No radiopaque foreign body. No discrete areas of osteolysis to suggest osteomyelitis. IMPRESSION: No acute findings. Specifically, no radiographic evidence osteomyelitis. Electronically Signed   By: Sandi Mariscal M.D.   On: 08/20/2018 18:05    Procedures Procedures (including critical care time)  Medications Ordered in UC Medications - No data to display  Initial Impression / Assessment and Plan / UC Course  I have reviewed the triage vital signs and the nursing notes.  Pertinent labs & imaging results that were available during my care of the patient were reviewed by me and considered in my medical decision making (see chart for details).     Acute on chronic foot wounds he typically treats with lidex. Now with increased pain and drainage. No indication of osteo on xray. Recommend close follow up with podiatry for more definitive treatment. Patient verbalized understanding and agreeable to plan.  Ambulatory out of clinic without difficulty with altered gait related to pain.  Final Clinical Impressions(s) / UC Diagnoses   Final diagnoses:  Visit for wound check     Discharge Instructions     We will start antibiotics for this due to the increase in pain with some drainage.  You may continue with the cream which typically works for you.  I recommend following up with podiatry for more definitive treatment as this does appear to be a unique wound.     ED Prescriptions    Medication Sig Dispense Auth. Provider   doxycycline (VIBRAMYCIN)  100 MG capsule Take 1 capsule (100 mg total) by mouth 2 (two) times daily. 20 capsule Zigmund Gottron, NP     Controlled Substance Prescriptions [place] Controlled Substance Registry consulted? Not Applicable   Zigmund Gottron, NP 08/20/18 2303

## 2018-08-20 NOTE — Discharge Instructions (Signed)
We will start antibiotics for this due to the increase in pain with some drainage.  You may continue with the cream which typically works for you.  I recommend following up with podiatry for more definitive treatment as this does appear to be a unique wound.

## 2018-08-20 NOTE — ED Triage Notes (Signed)
Pt here for left foot pain from chronic wound that is more severe than normal

## 2018-09-08 ENCOUNTER — Encounter (HOSPITAL_COMMUNITY): Payer: Self-pay | Admitting: Emergency Medicine

## 2018-09-08 ENCOUNTER — Ambulatory Visit (INDEPENDENT_AMBULATORY_CARE_PROVIDER_SITE_OTHER): Payer: 59

## 2018-09-08 ENCOUNTER — Ambulatory Visit (HOSPITAL_COMMUNITY)
Admission: EM | Admit: 2018-09-08 | Discharge: 2018-09-08 | Disposition: A | Discharge status: Home / Self Care | Payer: 59 | Attending: Emergency Medicine | Admitting: Emergency Medicine

## 2018-09-08 DIAGNOSIS — S46912A Strain of unspecified muscle, fascia and tendon at shoulder and upper arm level, left arm, initial encounter: Secondary | ICD-10-CM | POA: Diagnosis not present

## 2018-09-08 DIAGNOSIS — S4352XA Sprain of left acromioclavicular joint, initial encounter: Secondary | ICD-10-CM

## 2018-09-08 DIAGNOSIS — M25512 Pain in left shoulder: Secondary | ICD-10-CM

## 2018-09-08 DIAGNOSIS — S4992XA Unspecified injury of left shoulder and upper arm, initial encounter: Secondary | ICD-10-CM | POA: Diagnosis not present

## 2018-09-08 DIAGNOSIS — M62830 Muscle spasm of back: Secondary | ICD-10-CM

## 2018-09-08 DIAGNOSIS — S161XXA Strain of muscle, fascia and tendon at neck level, initial encounter: Secondary | ICD-10-CM

## 2018-09-08 DIAGNOSIS — S39012A Strain of muscle, fascia and tendon of lower back, initial encounter: Secondary | ICD-10-CM

## 2018-09-08 DIAGNOSIS — S46812A Strain of other muscles, fascia and tendons at shoulder and upper arm level, left arm, initial encounter: Secondary | ICD-10-CM

## 2018-09-08 MED ORDER — CYCLOBENZAPRINE HCL 10 MG PO TABS: 10.0000 mg | ORAL_TABLET | Freq: Every evening | ORAL | 0 refills | Status: DC | PRN

## 2018-09-08 MED ORDER — NAPROXEN 500 MG PO TABS: 500.0000 mg | ORAL_TABLET | Freq: Two times a day (BID) | ORAL | 0 refills | Status: DC
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
## 2018-09-08 NOTE — Discharge Instructions (Addendum)
X-rays did not show fracture or dislocation Rest, ice, heat, and gentle stretches as needed Ensure adequate ROM as tolerated. Injuries all appear to be muscular in nature. Prescribed naproxen as needed for inflammation and pain relief Prescribed flexeril as needed at bedtime for muscle spasm.  Do not drive or operate heavy machinery while taking this medication Expect some increased pain in the next 1-3 days.  It may take 3-4 weeks for complete resolution of symptoms Will f/u with his doctor or here if not seeing significant improvement within one week. Return here or go to ER if you have any new or worsening symptoms such as numbness/tingling of the inner thighs, loss of bladder or bowel control, headache/blurry vision, nausea/vomiting, confusion/altered mental status, dizziness, weakness, passing out, imbalance, etc..Marland Kitchen
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
## 2019-06-12 ENCOUNTER — Telehealth: Payer: 59 | Admitting: Physician Assistant

## 2019-06-12 ENCOUNTER — Inpatient Hospital Stay: Admission: RE | Admit: 2019-06-12 | Payer: 59 | Source: Ambulatory Visit

## 2019-06-12 DIAGNOSIS — R21 Rash and other nonspecific skin eruption: Secondary | ICD-10-CM

## 2019-06-12 MED ORDER — CLOTRIMAZOLE 1 % EX CREA: 1.0000 "application " | TOPICAL_CREAM | Freq: Two times a day (BID) | CUTANEOUS | 0 refills | Status: DC

## 2019-06-12 MED ORDER — DIPHENHYDRAMINE HCL 25 MG PO TABS: 25.0000 mg | ORAL_TABLET | Freq: Four times a day (QID) | ORAL | 0 refills | Status: DC | PRN

## 2019-06-12 NOTE — Progress Notes (Signed)
E Visit for Rash  We are sorry that you are not feeling well. Here is how we plan to help!  Based upon your presentation it appears you have a fungal infection.  I have prescribed: Clotrimazole 1% cream apply to affected areas twice daily.  You can also take benadryl every 6 hours as needed for itching.   HOME CARE:   Take cool showers and avoid direct sunlight.  Apply cool compress or wet dressings.  Take a bath in an oatmeal bath.  Sprinkle content of one Aveeno packet under running faucet with comfortably warm water.  Bathe for 15-20 minutes, 1-2 times daily.  Pat dry with a towel. Do not rub the rash.  Use hydrocortisone cream.  Take an antihistamine like Benadryl for widespread rashes that itch.  The adult dose of Benadryl is 25-50 mg by mouth 4 times daily.  Caution:  This type of medication may cause sleepiness.  Do not drink alcohol, drive, or operate dangerous machinery while taking antihistamines.  Do not take these medications if you have prostate enlargement.  Read package instructions thoroughly on all medications that you take.  GET HELP RIGHT AWAY IF:   Symptoms don't go away after treatment.  Severe itching that persists.  If you rash spreads or swells.  If you rash begins to smell.  If it blisters and opens or develops a yellow-brown crust.  You develop a fever.  You have a sore throat.  You become short of breath.  MAKE SURE YOU:  Understand these instructions. Will watch your condition. Will get help right away if you are not doing well or get worse.  Thank you for choosing an e-visit. Your e-visit answers were reviewed by a board certified advanced clinical practitioner to complete your personal care plan. Depending upon the condition, your plan could have included both over the counter or prescription medications. Please review your pharmacy choice. Be sure that the pharmacy you have chosen is open so that you can pick up your prescription now.   If there is a problem you may message your provider in [place] to have the prescription routed to another pharmacy. Your safety is important to [place]. If you have drug allergies check your prescription carefully.  For the next 24 hours, you can use MyChart to ask questions about today's visit, request a non-urgent call back, or ask for a work or school excuse from your e-visit provider. You will get an email in the next two days asking about your experience. I hope that your e-visit has been valuable and will speed your recovery.   Greater than 5 minutes, yet less than 10 minutes of time have been spent researching, coordinating, and implementing care for this patient today.

## 2019-06-13 ENCOUNTER — Telehealth: Payer: 59

## 2019-06-27 ENCOUNTER — Encounter (HOSPITAL_COMMUNITY): Payer: Self-pay

## 2019-06-27 ENCOUNTER — Ambulatory Visit (HOSPITAL_COMMUNITY)
Admission: EM | Admit: 2019-06-27 | Discharge: 2019-06-27 | Disposition: A | Discharge status: Home / Self Care | Payer: 59 | Attending: Urgent Care | Admitting: Urgent Care

## 2019-06-27 ENCOUNTER — Other Ambulatory Visit: Payer: Self-pay

## 2019-06-27 DIAGNOSIS — Z20822 Contact with and (suspected) exposure to covid-19: Secondary | ICD-10-CM | POA: Insufficient documentation

## 2019-06-27 DIAGNOSIS — R21 Rash and other nonspecific skin eruption: Secondary | ICD-10-CM | POA: Insufficient documentation

## 2019-06-27 DIAGNOSIS — B354 Tinea corporis: Secondary | ICD-10-CM | POA: Insufficient documentation

## 2019-06-27 DIAGNOSIS — L299 Pruritus, unspecified: Secondary | ICD-10-CM | POA: Insufficient documentation

## 2019-06-27 MED ORDER — HYDROXYZINE HCL 25 MG PO TABS: 12.5000 mg | ORAL_TABLET | Freq: Three times a day (TID) | ORAL | 0 refills | Status: DC | PRN

## 2019-06-27 MED ORDER — FLUCONAZOLE 150 MG PO TABS: 150.0000 mg | ORAL_TABLET | ORAL | 0 refills | Status: DC

## 2019-06-27 NOTE — Discharge Instructions (Addendum)
We will notify you of your COVID-19 test results as they arrive and may take between 2 to 7 days.  In the meantime, if you develop worsening symptoms including fever, chest pain, shortness of breath despite our current treatment plan then please report to the emergency room as this may be a sign of worsening status from possible COVID-19 infection.

## 2019-06-27 NOTE — ED Provider Notes (Signed)
[place]   MRN: [id number] DOB: 28-Mar-1966  Subjective:   Cameron Howell is a 54 y.o. male presenting for COVID-19 testing as required by his wife's employer.  He is also had several day history of multiple itchy spots over his limbs and upper back.  He has been taking Lamisil as prescribed by another provider but states is not helping.  No current facility-administered medications for this encounter.  Current Outpatient Medications:  .  clotrimazole (LOTRIMIN) 1 % cream, Apply 1 application topically 2 (two) times daily., Disp: 30 g, Rfl: 0 .  diphenhydrAMINE (BENADRYL) 25 MG tablet, Take 1 tablet (25 mg total) by mouth every 6 (six) hours as needed for itching., Disp: 30 tablet, Rfl: 0 .  hydrOXYzine (ATARAX/VISTARIL) 25 MG tablet, Take 0.5-1 tablets (12.5-25 mg total) by mouth every 8 (eight) hours as needed for itching., Disp: 60 tablet, Rfl: 0   No Known Allergies  Past Medical History:  Diagnosis Date  . Achilles tendon rupture 05/29/2013     History reviewed. No pertinent surgical history.  Family History  Problem Relation Age of Onset  . Cancer Mother   . Diabetes Mother   . Hypertension Mother   . Cancer Father   . Hypertension Father     Social History   Tobacco Use  . Smoking status: Current Every Day Smoker    Packs/day: 1.00    Years: 20.00    Pack years: 20.00    Types: Cigarettes  . Smokeless tobacco: Never Used  . Tobacco comment: 20 years smoking  Substance Use Topics  . Alcohol use: Yes    Alcohol/week: 0.0 standard drinks    Comment: socially  . Drug use: No    ROS   Objective:   Vitals: BP 122/85 (BP Location: Left Arm)   Pulse 70   Temp 98.8 F (37.1 C) (Oral)   Resp 18   SpO2 100%   Physical Exam Constitutional:      General: He is not in acute distress.    Appearance: Normal appearance. He is well-developed and normal weight. He is not ill-appearing, toxic-appearing or diaphoretic.  HENT:     Head: Normocephalic and  atraumatic.     Right Ear: External ear normal.     Left Ear: External ear normal.     Nose: Nose normal.     Mouth/Throat:     Pharynx: Oropharynx is clear.  Eyes:     General: No scleral icterus.       Right eye: No discharge.        Left eye: No discharge.     Extraocular Movements: Extraocular movements intact.     Pupils: Pupils are equal, round, and reactive to light.  Cardiovascular:     Rate and Rhythm: Normal rate.  Pulmonary:     Effort: Pulmonary effort is normal.  Musculoskeletal:     Cervical back: Normal range of motion.  Skin:    General: Skin is warm and dry.     Findings: Rash (Multiple annular lesions with central clearing of varying sizes ranging from 1 cm - 2cm scattered over his upper thighs, upper arms, back) present.  Neurological:     Mental Status: He is alert and oriented to person, place, and time.  Psychiatric:        Mood and Affect: Mood normal.        Behavior: Behavior normal.        Thought Content: Thought content normal.  Judgment: Judgment normal.      Assessment and Plan :   1. Rash and nonspecific skin eruption   2. Itching   3. Exposure to COVID-19 virus    Start diflucan for tinea corporis, hydroxyzine for itching. Counseled patient on nature of COVID-19 including modes of transmission, diagnostic testing, management and supportive care.  Counseled on medications used for symptomatic relief. COVID 19 testing is pending. Counseled patient on potential for adverse effects with medications prescribed/recommended today, ER and return-to-clinic precautions discussed, patient verbalized understanding.     Jaynee Eagles, [place] 06/27/19 1931

## 2019-06-27 NOTE — ED Triage Notes (Signed)
Patient presents to Urgent Care with complaints of needing a covid test since his wife's job [employer]) "wants to know if the people around her have covid. Patient reports he has no sx of covid and has not been around anyone with it.  He would also like a rash assessed, is on his bilateral arms and legs, is sometimes itchy.

## 2019-06-29 LAB — NOVEL CORONAVIRUS, NAA (HOSP ORDER, SEND-OUT TO REF LAB; TAT 18-24 HRS): SARS-CoV-2, NAA: NOT DETECTED

## 2019-09-20 ENCOUNTER — Ambulatory Visit: Payer: 59 | Attending: Internal Medicine

## 2019-09-20 DIAGNOSIS — Z23 Encounter for immunization: Secondary | ICD-10-CM

## 2019-09-20 NOTE — Progress Notes (Signed)
   Covid-19 Vaccination Clinic  Name:  Cameron Howell    MRN: [id number] DOB: [birth year]  09/20/2019  Cameron Howell was observed post Covid-19 immunization for 30 minutes based on pre-vaccination screening without incident. He was provided with Vaccine Information Sheet and instruction to access the V-Safe system.   Cameron Howell was instructed to call 911 with any severe reactions post vaccine: Marland Kitchen Difficulty breathing  . Swelling of face and throat  . A fast heartbeat  . A bad rash all over body  . Dizziness and weakness   Immunizations Administered    Name Date Dose VIS Date Route   Pfizer COVID-19 Vaccine 09/20/2019  9:29 AM 0.3 mL 05/25/2019 Intramuscular   Manufacturer: [place]   Lot: [id number]   [place]: [id number]

## 2019-10-15 ENCOUNTER — Ambulatory Visit: Payer: 59 | Attending: Internal Medicine

## 2019-10-15 DIAGNOSIS — Z23 Encounter for immunization: Secondary | ICD-10-CM

## 2019-10-15 NOTE — Progress Notes (Signed)
   Covid-19 Vaccination Clinic  Name:  Cameron Howell    MRN: [id number] DOB: 1966/05/06  10/15/2019  [name] was observed post Covid-19 immunization for 15 minutes without incident. He was provided with Vaccine Information Sheet and instruction to access the V-Safe system.   [name] was instructed to call 911 with any severe reactions post vaccine: Marland Kitchen Difficulty breathing  . Swelling of face and throat  . A fast heartbeat  . A bad rash all over body  . Dizziness and weakness   Immunizations Administered    Name Date Dose VIS Date Route   Pfizer COVID-19 Vaccine 10/15/2019  8:48 AM 0.3 mL 08/08/2018 Intramuscular   Manufacturer: [place]   Lot: [id number]   [place]: [id number]

## 2019-12-28 DIAGNOSIS — H52203 Unspecified astigmatism, bilateral: Secondary | ICD-10-CM | POA: Diagnosis not present

## 2019-12-28 DIAGNOSIS — H5203 Hypermetropia, bilateral: Secondary | ICD-10-CM | POA: Diagnosis not present

## 2020-04-28 ENCOUNTER — Encounter: Payer: Self-pay | Admitting: Internal Medicine

## 2020-05-21 ENCOUNTER — Encounter: Payer: Self-pay | Admitting: Internal Medicine

## 2020-06-03 ENCOUNTER — Encounter: Payer: Self-pay | Admitting: Internal Medicine

## 2020-06-16 ENCOUNTER — Ambulatory Visit: Payer: 59 | Attending: Internal Medicine

## 2020-06-16 DIAGNOSIS — Z23 Encounter for immunization: Secondary | ICD-10-CM

## 2020-06-16 NOTE — Progress Notes (Signed)
   Covid-19 Vaccination Clinic  Name:  Macauley Mossberg    MRN: [id number] DOB: [birth year]  06/16/2020  Mr. Gentles was observed post Covid-19 immunization for 15 minutes without incident. He was provided with Vaccine Information Sheet and instruction to access the V-Safe system.   Mr. Burleson was instructed to call 911 with any severe reactions post vaccine: Marland Kitchen Difficulty breathing  . Swelling of face and throat  . A fast heartbeat  . A bad rash all over body  . Dizziness and weakness   Immunizations Administered    Name Date Dose VIS Date Route   Pfizer COVID-19 Vaccine 06/16/2020  1:42 PM 0.3 mL 04/02/2020 Intramuscular   Manufacturer: [employer]   Lot: [id number]   [place]: [address]-0

## 2020-07-22 ENCOUNTER — Encounter: Payer: Self-pay | Admitting: Internal Medicine

## 2020-07-22 ENCOUNTER — Encounter: Payer: 59 | Admitting: Internal Medicine

## 2020-07-22 ENCOUNTER — Ambulatory Visit: Payer: 59 | Admitting: Internal Medicine

## 2020-07-22 ENCOUNTER — Other Ambulatory Visit: Payer: Self-pay

## 2020-07-22 DIAGNOSIS — R21 Rash and other nonspecific skin eruption: Secondary | ICD-10-CM

## 2020-07-22 DIAGNOSIS — N529 Male erectile dysfunction, unspecified: Secondary | ICD-10-CM

## 2020-07-22 MED ORDER — FLUOCINONIDE EMULSIFIED BASE 0.05 % EX CREA: 1.0000 "application " | TOPICAL_CREAM | Freq: Two times a day (BID) | CUTANEOUS | 2 refills | Status: DC

## 2020-07-22 MED ORDER — TADALAFIL 20 MG PO TABS: 10.0000 mg | ORAL_TABLET | ORAL | 11 refills | Status: DC | PRN

## 2020-07-22 NOTE — Assessment & Plan Note (Signed)
Lidex refilled today

## 2020-07-22 NOTE — Assessment & Plan Note (Signed)
Cialis refilled today

## 2020-07-22 NOTE — Progress Notes (Signed)
HPI  Pt presents to the clinic today to reestablish care and for management of the conditions listed below.   Rash of Legs: Managed with Lidex cream. He does need a refill of this today. He is not following with dermatology.  Flu: never Tetanus: < 10 years ago Covid: Pfizer x 3 PSA Screening: > 2 year ago Colon Screening: 08/2015 Vision Screening: as needed Dentist: as needed  Past Medical History:  Diagnosis Date  . Achilles tendon rupture 05/29/2013    Current Outpatient Medications  Medication Sig Dispense Refill  . clotrimazole (LOTRIMIN) 1 % cream Apply 1 application topically 2 (two) times daily. 30 g 0   No current facility-administered medications for this visit.    No Known Allergies  Family History  Problem Relation Age of Onset  . Cancer Mother   . Diabetes Mother   . Hypertension Mother   . Cancer Father   . Hypertension Father     Social History   Socioeconomic History  . Marital status: Married    Spouse name: Not on file  . Number of children: Not on file  . Years of education: Not on file  . Highest education level: Not on file  Occupational History  . Not on file  Tobacco Use  . Smoking status: Current Every Day Smoker    Packs/day: 1.00    Years: 20.00    Pack years: 20.00    Types: Cigarettes  . Smokeless tobacco: Never Used  . Tobacco comment: 20 years smoking  Vaping Use  . Vaping Use: Never used  Substance and Sexual Activity  . Alcohol use: Yes    Alcohol/week: 0.0 standard drinks    Comment: socially  . Drug use: No  . Sexual activity: Yes  Other Topics Concern  . Not on file  Social History Narrative  . Not on file   Social Determinants of Health   Financial Resource Strain: Not on file  Food Insecurity: Not on file  Transportation Needs: Not on file  Physical Activity: Not on file  Stress: Not on file  Social Connections: Not on file  Intimate Partner Violence: Not on file    ROS:  Constitutional: Denies fever,  malaise, fatigue, headache or abrupt weight changes.  HEENT: Denies eye pain, eye redness, ear pain, ringing in the ears, wax buildup, runny nose, nasal congestion, bloody nose, or sore throat. Respiratory: Denies difficulty breathing, shortness of breath, cough or sputum production.   Cardiovascular: Denies chest pain, chest tightness, palpitations or swelling in the hands or feet.  Gastrointestinal: Denies abdominal pain, bloating, constipation, diarrhea or blood in the stool.  GU: Pt reports erectile dysfunction. Denies frequency, urgency, pain with urination, blood in urine, odor or discharge. Musculoskeletal: Denies decrease in range of motion, difficulty with gait, muscle pain or joint pain and swelling.  Skin: Pt reports rash of legs. Denies redness, rashes, lesions or ulcercations.  Neurological: Denies dizziness, difficulty with memory, difficulty with speech or problems with balance and coordination.  Psych: Denies anxiety, depression, SI/HI.  No other specific complaints in a complete review of systems (except as listed in HPI above).  PE:  BP 128/82   Pulse 77   Temp 98 F (36.7 C) (Temporal)   Wt 183 lb (83 kg)   SpO2 98%   BMI 24.48 kg/m  Wt Readings from Last 3 Encounters:  07/22/20 183 lb (83 kg)  08/27/15 189 lb (85.7 kg)  03/27/15 188 lb (85.3 kg)    General: Appears her  stated age, well developed, well nourished in NAD. Skin: Scattered hyperpigmented lesions noted of BLE. Cardiovascular: Normal rate. Pulmonary/Chest: Normal effort. Musculoskeletal:  No difficulty with gait.  Neurological: Alert and oriented.  Psychiatric: Mood and affect normal. Behavior is normal. Judgment and thought content normal.     BMET    Component Value Date/Time   NA 139 08/27/2015 1516   K 4.2 08/27/2015 1516   CL 101 08/27/2015 1516   CO2 32 08/27/2015 1516   GLUCOSE 91 08/27/2015 1516   BUN 12 08/27/2015 1516   CREATININE 0.87 08/27/2015 1516   CALCIUM 9.9 08/27/2015  1516   GFRNONAA >60 12/27/2014 0200   GFRAA >60 12/27/2014 0200    Lipid Panel     Component Value Date/Time   CHOL 197 08/27/2015 1516   TRIG 182.0 (H) 08/27/2015 1516   HDL 28.60 (L) 08/27/2015 1516   CHOLHDL 7 08/27/2015 1516   VLDL 36.4 08/27/2015 1516   LDLCALC 132 (H) 08/27/2015 1516    CBC    Component Value Date/Time   WBC 5.2 08/27/2015 1516   RBC 4.73 08/27/2015 1516   HGB 14.7 08/27/2015 1516   HCT 44.0 08/27/2015 1516   PLT 220.0 08/27/2015 1516   MCV 93.2 08/27/2015 1516   MCH 31.5 12/27/2014 0200   MCHC 33.4 08/27/2015 1516   RDW 13.9 08/27/2015 1516   LYMPHSABS 2.4 12/27/2014 0200   MONOABS 0.8 12/27/2014 0200   EOSABS 0.4 12/27/2014 0200   BASOSABS 0.0 12/27/2014 0200    Hgb A1C Lab Results  Component Value Date   HGBA1C 6.1 05/24/2013     Assessment and Plan:   Webb Silversmith, NP This visit occurred during the SARS-CoV-2 public health emergency.  Safety protocols were in place, including screening questions prior to the visit, additional usage of staff PPE, and extensive cleaning of exam room while observing appropriate contact time as indicated for disinfecting solutions.

## 2020-07-22 NOTE — Patient Instructions (Signed)
Erectile Dysfunction Erectile dysfunction (ED) is the inability to get or keep an erection in order to have sexual intercourse. ED is considered a symptom of an underlying disorder and not considered a disease. Erectile dysfunction may include:  Inability to get an erection.  Lack of enough hardness of the erection to allow penetration.  Loss of the erection before sex is finished. What are the causes? This condition may be caused by:  Certain medicines, such as: ? Pain relievers. ? Antihistamines. ? Antidepressants. ? Blood pressure medicines. ? Water pills (diuretics). ? Ulcer medicines. ? Muscle relaxants. ? Drugs.  Excessive drinking.  Psychological causes, such as: ? Anxiety. ? Depression. ? Sadness. ? Exhaustion. ? Performance fear. ? Stress.  Physical causes, such as: ? Artery problems. This may include diabetes, smoking, liver disease, or atherosclerosis. ? High blood pressure. ? Hormonal problems, such as low testosterone. ? Obesity. ? Nerve problems. This may include back or pelvic injuries, diabetes mellitus, multiple sclerosis, or Parkinson's disease. What are the signs or symptoms? Symptoms of this condition include:  Inability to get an erection.  Lack of enough hardness of the erection to allow penetration.  Loss of the erection before sex is finished.  Normal erections at some times, but with frequent unsatisfactory episodes.  Low sexual satisfaction in either partner due to erection problems.  A curved penis occurring with erection. The curve may cause pain or the penis may be too curved to allow for intercourse.  Never having nighttime erections. How is this diagnosed? This condition is often diagnosed by:  Performing a physical exam to find other diseases or specific problems with the penis.  Asking you detailed questions about the problem.  Performing blood tests to check for diabetes mellitus or to measure hormone levels.  Performing  other tests to check for underlying health conditions.  Performing an ultrasound exam to check for scarring.  Performing a test to check blood flow to the penis.  Doing a sleep study at home to measure nighttime erections. How is this treated? This condition may be treated by:  Medicine taken by mouth to help you achieve an erection (oral medicine).  Hormone replacement therapy to replace low testosterone levels.  Medicine that is injected into the penis. Your health care provider may instruct you how to give yourself these injections at home.  Vacuum pump. This is a pump with a ring on it. The pump and ring are placed on the penis and used to create pressure that helps the penis become erect.  Penile implant surgery. In this procedure, you may receive: ? An inflatable implant. This consists of cylinders, a pump, and a reservoir. The cylinders can be inflated with a fluid that helps to create an erection, and they can be deflated after intercourse. ? A semi-rigid implant. This consists of two silicone rubber rods. The rods provide some rigidity. They are also flexible, so the penis can both curve downward in its normal position and become straight for sexual intercourse.  Blood vessel surgery, to improve blood flow to the penis. During this procedure, a blood vessel from a different part of the body is placed into the penis to allow blood to flow around (bypass) damaged or blocked blood vessels.  Lifestyle changes, such as exercising more, losing weight, and quitting smoking. Follow these instructions at home: Medicines  Take over-the-counter and prescription medicines only as told by your health care provider. Do not increase the dosage without first discussing it with your health care   provider.  If you are using self-injections, perform injections as directed by your health care provider. Make sure to avoid any veins that are on the surface of the penis. After giving an injection,  apply pressure to the injection site for 5 minutes.   General instructions  Exercise regularly, as directed by your health care provider. Work with your health care provider to lose weight, if needed.  Do not use any products that contain nicotine or tobacco, such as cigarettes and e-cigarettes. If you need help quitting, ask your health care provider.  Before using a vacuum pump, read the instructions that come with the pump and discuss any questions with your health care provider.  Keep all follow-up visits as told by your health care provider. This is important. Contact a health care provider if:  You feel nauseous.  You vomit. Get help right away if:  You are taking oral or injectable medicines and you have an erection that lasts longer than 4 hours. If your health care provider is unavailable, go to the nearest emergency room for evaluation. An erection that lasts much longer than 4 hours can result in permanent damage to your penis.  You have severe pain in your groin or abdomen.  You develop redness or severe swelling of your penis.  You have redness spreading up into your groin or lower abdomen.  You are unable to urinate.  You experience chest pain or a rapid heart beat (palpitations) after taking oral medicines. Summary  Erectile dysfunction (ED) is the inability to get or keep an erection during sexual intercourse. This problem can usually be treated successfully.  This condition is diagnosed based on a physical exam, your symptoms, and tests to determine the cause. Treatment varies depending on the cause and may include medicines, hormone therapy, surgery, or a vacuum pump.  You may need follow-up visits to make sure that you are using your medicines or devices correctly.  Get help right away if you are taking or injecting medicines and you have an erection that lasts longer than 4 hours. This information is not intended to replace advice given to you by your health  care provider. Make sure you discuss any questions you have with your health care provider. Document Revised: 02/15/2020 Document Reviewed: 02/15/2020 Elsevier Patient Education  2021 [employer] Inc[employer]  

## 2020-08-26 ENCOUNTER — Telehealth: Payer: Self-pay | Admitting: Internal Medicine

## 2020-08-26 ENCOUNTER — Ambulatory Visit: Payer: 59 | Admitting: Internal Medicine

## 2020-08-26 ENCOUNTER — Other Ambulatory Visit: Payer: Self-pay

## 2020-08-26 ENCOUNTER — Other Ambulatory Visit: Payer: Self-pay | Admitting: Internal Medicine

## 2020-08-26 ENCOUNTER — Encounter: Payer: Self-pay | Admitting: Internal Medicine

## 2020-08-26 VITALS — BP 122/84 | HR 81 | Temp 98.7°F | Wt 184.0 lb

## 2020-08-26 DIAGNOSIS — G44019 Episodic cluster headache, not intractable: Secondary | ICD-10-CM

## 2020-08-26 MED ORDER — SUMATRIPTAN 20 MG/ACT NA SOLN: 20.0000 mg | NASAL | 0 refills | Status: DC | PRN

## 2020-08-26 NOTE — Progress Notes (Signed)
Subjective:    Patient ID: Cameron Howell, male    DOB: Aug 21, 1965, 55 y.o.   MRN: [id number]  HPI  Pt presents to the clinic today with c/o migraines. This started 3 weeks ago. These seem to occur only in the evening. The pain is located left temple. He describes the pain as sharp. He reports associated sensitivity to light, tearing from the left eye and nasal congestion on the left side of his nose. He denies dizziness, visual changes, sensitivity to sound, nausea or vomiting. He denies neck or shoulder pain. He does not feel sleep derived or stressed. He has tried consuming caffeine, taking Tylenol, Ibuprofen and leftover Hydrocodone with some relief of symptoms. His last eye exam was in 2021.  Review of Systems      Past Medical History:  Diagnosis Date  . Achilles tendon rupture 05/29/2013    Current Outpatient Medications  Medication Sig Dispense Refill  . clotrimazole (LOTRIMIN) 1 % cream Apply 1 application topically 2 (two) times daily. 30 g 0  . fluocinonide-emollient (LIDEX-E) 0.05 % cream Apply 1 application topically 2 (two) times daily. 60 g 2  . tadalafil (CIALIS) 20 MG tablet Take 0.5-1 tablets (10-20 mg total) by mouth every other day as needed for erectile dysfunction. 5 tablet 11   No current facility-administered medications for this visit.    No Known Allergies  Family History  Problem Relation Age of Onset  . Cancer Mother   . Diabetes Mother   . Hypertension Mother   . Cancer Father   . Hypertension Father     Social History   Socioeconomic History  . Marital status: Married    Spouse name: Not on file  . Number of children: Not on file  . Years of education: Not on file  . Highest education level: Not on file  Occupational History  . Not on file  Tobacco Use  . Smoking status: Current Every Day Smoker    Packs/day: 1.00    Years: 20.00    Pack years: 20.00    Types: Cigarettes  . Smokeless tobacco: Never Used  . Tobacco comment: 20 years  smoking  Vaping Use  . Vaping Use: Never used  Substance and Sexual Activity  . Alcohol use: Yes    Alcohol/week: 0.0 standard drinks    Comment: socially  . Drug use: No  . Sexual activity: Yes  Other Topics Concern  . Not on file  Social History Narrative  . Not on file   Social Determinants of Health   Financial Resource Strain: Not on file  Food Insecurity: Not on file  Transportation Needs: Not on file  Physical Activity: Not on file  Stress: Not on file  Social Connections: Not on file  Intimate Partner Violence: Not on file     Constitutional: Pt reports headache. Denies fever, malaise, fatigue, or abrupt weight changes.  HEENT: Pt reports sensitivity to light, nasal congestion and tearing of the left eye. Denies eye pain, eye redness, ear pain, ringing in the ears, wax buildup, runny nose,  bloody nose, or sore throat. Respiratory: Denies difficulty breathing, shortness of breath, cough or sputum production.   Cardiovascular: Denies chest pain, chest tightness, palpitations or swelling in the hands or feet.  Musculoskeletal: Denies decrease in range of motion, difficulty with gait, muscle pain or joint pain and swelling.   Neurological: Denies dizziness, difficulty with memory, difficulty with speech or problems with balance and coordination.    No other specific complaints  in a complete review of systems (except as listed in HPI above).  Objective:   Physical Exam  BP 122/84   Pulse 81   Temp 98.7 F (37.1 C) (Temporal)   Wt 184 lb (83.5 kg)   SpO2 99%   BMI 24.61 kg/m   Wt Readings from Last 3 Encounters:  07/22/20 183 lb (83 kg)  08/27/15 189 lb (85.7 kg)  03/27/15 188 lb (85.3 kg)    General: Appears his stated age, well developed, well nourished in NAD. Skin: Warm, dry and intact. No rashes noted. HEENT: Head: normal shape and size; Eyes: sclera white, no icterus, and EOMs intact;  Cardiovascular: Normal rate. Pulmonary/Chest: Normal  effort. Musculoskeletal:  No difficulty with gait.  Neurological: Alert and oriented.  Coordination normal.  Psychiatric: Mood and affect normal. Behavior is normal. Judgment and thought content normal.    BMET    Component Value Date/Time   NA 139 08/27/2015 1516   K 4.2 08/27/2015 1516   CL 101 08/27/2015 1516   CO2 32 08/27/2015 1516   GLUCOSE 91 08/27/2015 1516   BUN 12 08/27/2015 1516   CREATININE 0.87 08/27/2015 1516   CALCIUM 9.9 08/27/2015 1516   GFRNONAA >60 12/27/2014 0200   GFRAA >60 12/27/2014 0200    Lipid Panel     Component Value Date/Time   CHOL 197 08/27/2015 1516   TRIG 182.0 (H) 08/27/2015 1516   HDL 28.60 (L) 08/27/2015 1516   CHOLHDL 7 08/27/2015 1516   VLDL 36.4 08/27/2015 1516   LDLCALC 132 (H) 08/27/2015 1516    CBC    Component Value Date/Time   WBC 5.2 08/27/2015 1516   RBC 4.73 08/27/2015 1516   HGB 14.7 08/27/2015 1516   HCT 44.0 08/27/2015 1516   PLT 220.0 08/27/2015 1516   MCV 93.2 08/27/2015 1516   MCH 31.5 12/27/2014 0200   MCHC 33.4 08/27/2015 1516   RDW 13.9 08/27/2015 1516   LYMPHSABS 2.4 12/27/2014 0200   MONOABS 0.8 12/27/2014 0200   EOSABS 0.4 12/27/2014 0200   BASOSABS 0.0 12/27/2014 0200    Hgb A1C Lab Results  Component Value Date   HGBA1C 6.1 05/24/2013            Assessment & Plan:     Webb Silversmith, NP This visit occurred during the SARS-CoV-2 public health emergency.  Safety protocols were in place, including screening questions prior to the visit, additional usage of staff PPE, and extensive cleaning of exam room while observing appropriate contact time as indicated for disinfecting solutions.

## 2020-08-26 NOTE — Patient Instructions (Signed)
Cluster Headache Cluster headaches hurt a lot. They normally happen on one side of your head, but they may switch sides. Often, cluster headaches:  Cause a lot of pain.  Happen for weeks to months.  Last from 15 minutes to 3 hours.  Happen at the same time each day.  Happen at night.  Happen many times a day.  Happen more often in the fall and springtime. What are the causes? The exact cause is not known. They are not usually caused by foods, changes in body chemicals (hormonal changes), or stress. What increases the risk?  Being a male between the ages of 50-70 years old.  Smoking or using products that contain nicotine or tobacco.  Having elevated levels of body chemical called histamine. This can happen in people who have allergies.  Taking certain medicines that cause blood vessels to expand.  Having a parent or brother or sister who has cluster headaches. What are the signs or symptoms?  Very bad pain on one side of the head that begins behind or around your eye but may spread to your face, head, and neck.  Feeling like you may vomit (nauseous).  Being sensitive to light.  Runny nose and stuffy nose.  Swelling of the forehead or face on the affected side.  Eye problems. This might include a droopy or swollen eyelid, eye redness, or tearing on the affected side.  Feeling restless or upset.  Pale skin or a flushed face. How is this treated?  Medicines.  Oxygen that is breathed in through a mask. Follow these instructions at home: Headache diary Keep a headache diary as told by your doctor. Doing this can help you and your doctor figure out what triggers your headaches. In your headache diary, include information about:  The time of day that your headache started and what you were doing when it began.  How long your headache lasted.  Where your pain started and whether it moved to other areas.  The type of pain.  Your level of pain. Use a pain scale  and rate the pain with a number from 1 (mild) up to 10 (very bad).  The treatment that you used, and any change in symptoms after treatment.   Medicines  Take over-the-counter and prescription medicines only as told by your health care provider.  Ask your doctor if the medicine prescribed to you: ? Requires you to avoid driving or using machinery. ? Can cause trouble pooping (constipation). You may need to take these actions to prevent or treat trouble pooping:  Drink enough fluid to keep your pee (urine) pale yellow.  Take over-the-counter or prescription medicines.  Eat foods that are high in fiber. These include beans, whole grains, and fresh fruits and vegetables.  Limit foods that are high in fat and processed sugars. These include fried or sweet foods. Lifestyle  Go to bed at the same time each night. Get the same amount of sleep every night. Get 7-9 hours of sleep each night, or the amount recommended by your doctor.  Limit or manage stress.  Exercise regularly. Exercise for at least 30 minutes, 5 times each week.  Eat a healthy diet. Avoid any foods that you know may trigger your headaches.  Do not drink alcohol.  Do not use any products that contain nicotine or tobacco, such as cigarettes, e-cigarettes, and chewing tobacco. If you need help quitting, ask your doctor.   General instructions  Use oxygen as told by your doctor.  Keep  all follow-up visits as told by your health care provider. This is important. Contact a doctor if:  Your headaches: ? Change. ? Get worse. ? Happen more often.  Your medicines or oxygen are not helping. Get help right away if:  You faint.  You get weak or lose feeling (have numbness) on one side of your body or face.  You see two of everything (double vision).  You feel you may vomit or you vomit and it does stop after many hours.  You have trouble with your balance or with walking.  You have trouble talking.  You have neck  pain or stiffness and you have a fever. Summary  Cluster headaches hurt a lot.  Keep a headache diary.  Do not drink alcohol.  Medicines and oxygen may help you feel better. This information is not intended to replace advice given to you by your health care provider. Make sure you discuss any questions you have with your health care provider. Document Revised: 02/01/2020 Document Reviewed: 07/05/2019 Elsevier Patient Education  2021 [employer].

## 2020-08-26 NOTE — Telephone Encounter (Signed)
Pt called in wanted to know about getting a prescription to be changed to another pharmacy due to [facility] called and told it would 2-5 days before its in stock..  [facility]

## 2020-08-26 NOTE — Assessment & Plan Note (Signed)
New onset Avoid alcohol RX for Sumatriptan nasal spray 20 mg intranasally x 1, may repeat in 2 hours if no effect  Update me in 1 week and let me know how you are doing

## 2020-09-03 ENCOUNTER — Other Ambulatory Visit (HOSPITAL_BASED_OUTPATIENT_CLINIC_OR_DEPARTMENT_OTHER): Payer: Self-pay

## 2020-09-04 ENCOUNTER — Other Ambulatory Visit: Payer: Self-pay

## 2020-09-04 ENCOUNTER — Ambulatory Visit (INDEPENDENT_AMBULATORY_CARE_PROVIDER_SITE_OTHER): Payer: 59 | Admitting: Internal Medicine

## 2020-09-04 ENCOUNTER — Encounter: Payer: Self-pay | Admitting: Internal Medicine

## 2020-09-04 VITALS — BP 124/82 | HR 72 | Temp 98.3°F | Ht 72.5 in | Wt 181.0 lb

## 2020-09-04 DIAGNOSIS — N5201 Erectile dysfunction due to arterial insufficiency: Secondary | ICD-10-CM | POA: Diagnosis not present

## 2020-09-04 DIAGNOSIS — Z125 Encounter for screening for malignant neoplasm of prostate: Secondary | ICD-10-CM

## 2020-09-04 DIAGNOSIS — Z1211 Encounter for screening for malignant neoplasm of colon: Secondary | ICD-10-CM | POA: Diagnosis not present

## 2020-09-04 DIAGNOSIS — G44011 Episodic cluster headache, intractable: Secondary | ICD-10-CM

## 2020-09-04 DIAGNOSIS — G44019 Episodic cluster headache, not intractable: Secondary | ICD-10-CM | POA: Diagnosis not present

## 2020-09-04 DIAGNOSIS — Z0001 Encounter for general adult medical examination with abnormal findings: Secondary | ICD-10-CM

## 2020-09-04 LAB — CBC
HCT: 43.8 % (ref 39.0–52.0)
Hemoglobin: 15 g/dL (ref 13.0–17.0)
MCHC: 34.3 g/dL (ref 30.0–36.0)
MCV: 95.4 fl (ref 78.0–100.0)
Platelets: 201 10*3/uL (ref 150.0–400.0)
RBC: 4.59 Mil/uL (ref 4.22–5.81)
RDW: 12.8 % (ref 11.5–15.5)
WBC: 3.2 10*3/uL — ABNORMAL LOW (ref 4.0–10.5)

## 2020-09-04 LAB — COMPREHENSIVE METABOLIC PANEL
ALT: 19 U/L (ref 0–53)
AST: 14 U/L (ref 0–37)
Albumin: 4.6 g/dL (ref 3.5–5.2)
Alkaline Phosphatase: 43 U/L (ref 39–117)
BUN: 12 mg/dL (ref 6–23)
CO2: 31 mEq/L (ref 19–32)
Calcium: 9.4 mg/dL (ref 8.4–10.5)
Chloride: 103 mEq/L (ref 96–112)
Creatinine, Ser: 0.96 mg/dL (ref 0.40–1.50)
GFR: 88.95 mL/min (ref >60.00)
Glucose, Bld: 101 mg/dL — ABNORMAL HIGH (ref 70–99)
Potassium: 4.5 mEq/L (ref 3.5–5.1)
Sodium: 140 mEq/L (ref 135–145)
Total Bilirubin: 0.6 mg/dL (ref 0.2–1.2)
Total Protein: 6.8 g/dL (ref 6.0–8.3)

## 2020-09-04 LAB — HEMOGLOBIN A1C: Hgb A1c MFr Bld: 5.9 % (ref 4.6–6.5)

## 2020-09-04 LAB — LIPID PANEL
Cholesterol: 174 mg/dL (ref 0–200)
HDL: 29.3 mg/dL — ABNORMAL LOW (ref >39.00)
LDL Cholesterol: 110 mg/dL — ABNORMAL HIGH (ref 0–99)
NonHDL: 145.02
Total CHOL/HDL Ratio: 6
Triglycerides: 177 mg/dL — ABNORMAL HIGH (ref 0.0–149.0)
VLDL: 35.4 mg/dL (ref 0.0–40.0)

## 2020-09-04 LAB — PSA: PSA: 1.06 ng/mL (ref 0.10–4.00)

## 2020-09-04 NOTE — Assessment & Plan Note (Signed)
Unable to take Cialis due to side effects We will monitor

## 2020-09-04 NOTE — Assessment & Plan Note (Signed)
Persistent, he would like further evaluation of this.   MRI brain ordered Continue Imitrex nasal spray as needed Consider referral to neurology for further evaluation and treatment

## 2020-09-04 NOTE — Progress Notes (Signed)
Subjective:    Patient ID: Cameron Howell, male    DOB: 05-02-1966, 55 y.o.   MRN: [id number]  HPI  Frequent Headaches: He reports 3 headaches since his last visit, managed on Imitrex as needed with good relief of symptoms.  He has found that alcohol and Cialis are triggers for his headaches.  He is not following with neurology.  ED: He is unable to take Cialis due to new onset headaches.  Flu: never Tetanus: < 10 years ago Covid: Pfizer x 3 PSA Screening: Colon Screening: Cologuard 2017 Vision Screening: as needed Dentist: as needed  Diet: He does eat meat. He consumes fruits and veggies. He does eat some fried foods. He drinks mostly soda, water. Exercise: None  Review of Systems  Past Medical History:  Diagnosis Date  . Achilles tendon rupture 05/29/2013    Current Outpatient Medications  Medication Sig Dispense Refill  . clotrimazole (LOTRIMIN) 1 % cream Apply 1 application topically 2 (two) times daily. 30 g 0  . fluocinonide-emollient (LIDEX-E) 0.05 % cream Apply 1 application topically 2 (two) times daily. 60 g 2  . SUMAtriptan (IMITREX) 20 MG/ACT nasal spray Place 1 spray (20 mg total) into the nose every 2 (two) hours as needed for migraine or headache. May repeat in 2 hours if headache persists or recurs. 1 each 0  . tadalafil (CIALIS) 20 MG tablet Take 0.5-1 tablets (10-20 mg total) by mouth every other day as needed for erectile dysfunction. 5 tablet 11   No current facility-administered medications for this visit.    No Known Allergies  Family History  Problem Relation Age of Onset  . Cancer Mother   . Diabetes Mother   . Hypertension Mother   . Cancer Father   . Hypertension Father     Social History   Socioeconomic History  . Marital status: Married    Spouse name: Not on file  . Number of children: Not on file  . Years of education: Not on file  . Highest education level: Not on file  Occupational History  . Not on file  Tobacco Use  . Smoking  status: Current Every Day Smoker    Packs/day: 1.00    Years: 20.00    Pack years: 20.00    Types: Cigarettes  . Smokeless tobacco: Never Used  . Tobacco comment: 20 years smoking  Vaping Use  . Vaping Use: Never used  Substance and Sexual Activity  . Alcohol use: Yes    Alcohol/week: 0.0 standard drinks    Comment: socially  . Drug use: No  . Sexual activity: Yes  Other Topics Concern  . Not on file  Social History Narrative  . Not on file   Social Determinants of Health   Financial Resource Strain: Not on file  Food Insecurity: Not on file  Transportation Needs: Not on file  Physical Activity: Not on file  Stress: Not on file  Social Connections: Not on file  Intimate Partner Violence: Not on file     Constitutional: Patient reports intermittent headaches.  Denies fever, malaise, fatigue, or abrupt weight changes.  HEENT: Denies eye pain, eye redness, ear pain, ringing in the ears, wax buildup, runny nose, nasal congestion, bloody nose, or sore throat. Respiratory: Denies difficulty breathing, shortness of breath, cough or sputum production.   Cardiovascular: Denies chest pain, chest tightness, palpitations or swelling in the hands or feet.  Gastrointestinal: Denies abdominal pain, bloating, constipation, diarrhea or blood in the stool.  GU: Patient has  a history of erectile dysfunction.  Denies urgency, frequency, pain with urination, burning sensation, blood in urine, odor or discharge. Musculoskeletal: Denies decrease in range of motion, difficulty with gait, muscle pain or joint pain and swelling.  Skin: Denies redness, rashes, lesions or ulcercations.  Neurological: Denies dizziness, difficulty with memory, difficulty with speech or problems with balance and coordination.  Psych: Denies anxiety, depression, SI/HI.  No other specific complaints in a complete review of systems (except as listed in HPI above).     Objective:   Physical Exam  BP 124/82   Pulse 72    Temp 98.3 F (36.8 C) (Temporal)   Ht 6' 0.5" (1.842 m)   Wt 181 lb (82.1 kg)   SpO2 98%   BMI 24.21 kg/m   Wt Readings from Last 3 Encounters:  08/26/20 184 lb (83.5 kg)  07/22/20 183 lb (83 kg)  08/27/15 189 lb (85.7 kg)    General: Appears his stated age, well developed, well nourished in NAD. Skin: Warm, dry and intact. No rashes noted. HEENT: Head: normal shape and size; Eyes: sclera white, no icterus, conjunctiva pink, PERRLA and EOMs intact; Neck:  Neck supple, trachea midline. No masses, lumps or thyromegaly present.  Cardiovascular: Normal rate and rhythm. S1,S2 noted.  No murmur, rubs or gallops noted. No JVD or BLE edema. No carotid bruits noted. Pulmonary/Chest: Normal effort and positive vesicular breath sounds. No respiratory distress. No wheezes, rales or ronchi noted.  Abdomen: Soft and nontender. Normal bowel sounds. No distention or masses noted. Liver, spleen and kidneys non palpable. Musculoskeletal: Strength 5/5 BUE/BLE no difficulty with gait.  Neurological: Alert and oriented. Cranial nerves II-XII grossly intact. Coordination normal.  Psychiatric: Mood and affect normal. Behavior is normal. Judgment and thought content normal.     BMET    Component Value Date/Time   NA 139 08/27/2015 1516   K 4.2 08/27/2015 1516   CL 101 08/27/2015 1516   CO2 32 08/27/2015 1516   GLUCOSE 91 08/27/2015 1516   BUN 12 08/27/2015 1516   CREATININE 0.87 08/27/2015 1516   CALCIUM 9.9 08/27/2015 1516   GFRNONAA >60 12/27/2014 0200   GFRAA >60 12/27/2014 0200    Lipid Panel     Component Value Date/Time   CHOL 197 08/27/2015 1516   TRIG 182.0 (H) 08/27/2015 1516   HDL 28.60 (L) 08/27/2015 1516   CHOLHDL 7 08/27/2015 1516   VLDL 36.4 08/27/2015 1516   LDLCALC 132 (H) 08/27/2015 1516    CBC    Component Value Date/Time   WBC 5.2 08/27/2015 1516   RBC 4.73 08/27/2015 1516   HGB 14.7 08/27/2015 1516   HCT 44.0 08/27/2015 1516   PLT 220.0 08/27/2015 1516   MCV  93.2 08/27/2015 1516   MCH 31.5 12/27/2014 0200   MCHC 33.4 08/27/2015 1516   RDW 13.9 08/27/2015 1516   LYMPHSABS 2.4 12/27/2014 0200   MONOABS 0.8 12/27/2014 0200   EOSABS 0.4 12/27/2014 0200   BASOSABS 0.0 12/27/2014 0200    Hgb A1C Lab Results  Component Value Date   HGBA1C 6.1 05/24/2013            Assessment & Plan:   Preventative Health Maintenance:  He declines flu shot today Tetanus UTD per his report.  Advised him if he gets cut or bitten he would need to go get a tetanus vaccine Covid vaccine UTD Cologuard ordered Enocuraged him to consume a balanced diet and exercise regimen Advised him to see an eye doctor  and dentist annually Will check CBC, CMET, Lipid, A1C and PSA today  RTC in 1 year, sooner if needed Webb Silversmith, NP This visit occurred during the SARS-CoV-2 public health emergency.  Safety protocols were in place, including screening questions prior to the visit, additional usage of staff PPE, and extensive cleaning of exam room while observing appropriate contact time as indicated for disinfecting solutions.

## 2020-09-04 NOTE — Patient Instructions (Signed)

## 2020-09-05 ENCOUNTER — Encounter: Payer: Self-pay | Admitting: Internal Medicine

## 2020-09-24 ENCOUNTER — Other Ambulatory Visit: Payer: Self-pay

## 2020-09-24 ENCOUNTER — Ambulatory Visit
Admission: RE | Admit: 2020-09-24 | Discharge: 2020-09-24 | Disposition: A | Discharge status: Home / Self Care | Payer: 59 | Source: Ambulatory Visit | Attending: Internal Medicine | Admitting: Internal Medicine

## 2020-09-24 DIAGNOSIS — G44011 Episodic cluster headache, intractable: Secondary | ICD-10-CM

## 2020-09-24 DIAGNOSIS — R519 Headache, unspecified: Secondary | ICD-10-CM | POA: Diagnosis not present

## 2020-09-27 ENCOUNTER — Other Ambulatory Visit: Payer: 59

## 2020-11-28 ENCOUNTER — Telehealth: Payer: Self-pay | Admitting: Nurse Practitioner

## 2020-11-28 NOTE — Telephone Encounter (Signed)
Called to inform patient Cameron Howell has agreed to take over his care. Need to schedule appt. Mailbox full. Unable to leave a message.

## 2021-01-14 NOTE — Progress Notes (Addendum)
Subjective:    Patient ID: Cameron Howell, male    DOB: 1965-09-06, 55 y.o.   MRN: [id number]  HPI: Cameron Howell is a 55 y.o. male presenting for new patient visit to establish care.  Introduced to Designer, jewellery role and practice setting.  All questions answered.  Discussed provider/patient relationship and expectations.  Chief Complaint  Patient presents with   New Patient (Initial Visit)   Had achilles tendon rupture in 2014 and this "sidelined" his ability to play basketball.  Did some therapy and had it looked at afterward and reports because it was healed, was not a candidate for surgical repair.  Still has some issues with it, but overall tries to stay active.  Smokes cigarettes when he is bored.  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  He does have motivation to quit because he knows it is not good for him and is a risk factor to cause cancer.   HYPERLIPIDEMIA Elevated triglycerides and LDL at last check with previous PCP in March 2022.  Has not been able to make dietary changes since have dental work and issues with his teeth. Hyperlipidemia status: not treated Satisfied with current treatment?  N/a Side effects:  n/a Medication compliance: n/a Past cholesterol meds: none Aspirin:  no The 10-year ASCVD risk score Cameron Howell., et al., 2013) is: 10.1%   Values used to calculate the score:     Age: 15 years     Sex: Male     Is Non-Hispanic African American: Yes     Diabetic: No     Tobacco smoker: Yes     Systolic Blood Pressure: XX123456 mmHg     Is BP treated: No     HDL Cholesterol: 29.3 mg/dL     Total Cholesterol: 174 mg/dL Chest pain:  no Coronary artery disease:  no Family history CAD:  no Family history early CAD:  no  Of note, he does have a significant family history for colon cancer.  He was previously screened with Cologuard and does not desire colonoscopy.  He denies changes in his bowel movements, blood in stool,  unexplained weight loss.   No Known Allergies  Outpatient Encounter Medications as of 01/15/2021  Medication Sig   clotrimazole (LOTRIMIN) 1 % cream Apply 1 application topically 2 (two) times daily.   [DISCONTINUED] fluocinonide-emollient (LIDEX-E) 0.05 % cream Apply 1 application topically 2 (two) times daily. (Patient not taking: Reported on 01/15/2021)   [DISCONTINUED] SUMAtriptan (IMITREX) 20 MG/ACT nasal spray PLACE 1 SPRAY INTO THE NOSE AS NEEDED FOR MIGRAINE OR HEADACHE. MAY REPEAT IN 2 HOURS IF HEADACHE PERSISTS OR RECURS (Patient not taking: Reported on 01/15/2021)   [DISCONTINUED] SUMAtriptan (IMITREX) 20 MG/ACT nasal spray PLACE 1 SPRAY INTO THE NOSE AS NEEDED FOR MIGRAINE OR HEADACHE. MAY REPEAT IN 2 HOURS IF HEADACHE PERSIST OR RECURS   No facility-administered encounter medications on file as of 01/15/2021.    Active Ambulatory Problems    Diagnosis Date Noted   Tobacco use disorder 05/29/2013   ED (erectile dysfunction) 07/22/2020   Episodic cluster headache, not intractable 08/26/2020   Mixed hyperlipidemia 01/15/2021   Resolved Ambulatory Problems    Diagnosis Date Noted   Achilles tendon rupture 05/29/2013   Cocaine dependence, episodic ([place]) 07/18/2017   Alcohol use disorder, severe, dependence ([place]) 07/18/2017   Rash 07/22/2020   No Additional Past Medical History    Past Medical History:  Diagnosis Date  Achilles tendon rupture 05/29/2013    Past Surgical History:  Procedure Laterality Date   EYE MUSCLE SURGERY      Social History   Tobacco Use   Smoking status: Every Day    Packs/day: 1.00    Years: 20.00    Pack years: 20.00    Types: Cigarettes   Smokeless tobacco: Never   Tobacco comments:    20 years smoking  Vaping Use   Vaping Use: Never used  Substance Use Topics   Alcohol use: Yes    Alcohol/week: 0.0 standard drinks    Comment: socially   Drug use: No    Family History  Problem Relation Age of Onset   Cancer Mother    Diabetes  Mother    Hypertension Mother    Cancer Father    Hypertension Father    Colon cancer Father     Review of Systems Per HPI unless specifically indicated above     Objective:    BP 112/84   Pulse 84   Temp 98.4 F (36.9 C)   Ht 6' 0.5" (1.842 m)   Wt 178 lb 12.8 oz (81.1 kg)   SpO2 98%   BMI 23.92 kg/m   Wt Readings from Last 3 Encounters:  01/15/21 178 lb 12.8 oz (81.1 kg)  09/04/20 181 lb (82.1 kg)  08/26/20 184 lb (83.5 kg)    Physical Exam Vitals and nursing note reviewed.  Constitutional:      General: He is not in acute distress.    Appearance: Normal appearance. He is normal weight. He is not toxic-appearing.  HENT:     Head: Normocephalic and atraumatic.     Nose: Nose normal. No congestion.     Mouth/Throat:     Mouth: Mucous membranes are moist.     Pharynx: Oropharynx is clear.     Comments: Dentures upper and lower Eyes:     General: No scleral icterus.    Extraocular Movements: Extraocular movements intact.  Neck:     Vascular: No carotid bruit.  Cardiovascular:     Rate and Rhythm: Normal rate.     Heart sounds: Normal heart sounds. No murmur heard. Pulmonary:     Effort: Pulmonary effort is normal. No respiratory distress.     Breath sounds: Normal breath sounds. No wheezing or rhonchi.  Abdominal:     General: Abdomen is flat. Bowel sounds are normal. There is no distension.     Tenderness: There is no right CVA tenderness or left CVA tenderness.  Musculoskeletal:        General: Normal range of motion.     Cervical back: Normal range of motion.     Right lower leg: No edema.     Left lower leg: No edema.  Lymphadenopathy:     Cervical: No cervical adenopathy.  Skin:    General: Skin is warm and dry.     Capillary Refill: Capillary refill takes less than 2 seconds.     Coloration: Skin is not jaundiced or pale.  Neurological:     General: No focal deficit present.     Mental Status: He is alert and oriented to person, place, and time.      Motor: No weakness.     Gait: Gait normal.  Psychiatric:        Mood and Affect: Mood normal.        Behavior: Behavior normal.        Thought Content: Thought content normal.  Judgment: Judgment normal.      Assessment & Plan:  1. Encounter to establish care  2. Screening for colon cancer Discussed screening with colonoscopy vs. Cologuard.  Encouraged colonoscopy given family history in father of colon cancer, however patient declines and chooses Cologuard.  Cologuaad ordered.  3. Mixed hyperlipidemia Discussed The 10-year ASCVD risk score Cameron Howell., et al., 2013) is: 10.1%   Values used to calculate the score:     Age: 50 years     Sex: Male     Is Non-Hispanic African American: Yes     Diabetic: No     Tobacco smoker: Yes     Systolic Blood Pressure: [id number] mmHg     Is BP treated: No     HDL Cholesterol: 29.3 mg/dL     Total Cholesterol: 174 mg/dL Today.  He is interested in quitting smoking and I have recommended this. We will recheck lipid levels when patient returns fasting and consider statin therapy pending results.   - CBC with Differential/Platelet; Future - Lipid panel; Future - COMPLETE METABOLIC PANEL WITH GFR; Future  4. Need for tetanus booster Patient will return to Tdap when he gets fasting blood work drawn.  5. Tobacco use disorder Encouraged cutting back to quit method.  Replace 1 cigarette weekly with alternative like mint, sucker, popsicle, etc. And continue increasing placement of cigarette by 1 weekly until cessation.  6. Encounter for tobacco use cessation counseling Tobacco use disorder - discussed cessation today.    Follow up plan: Return for nurse visit for tetanus shot and labs.

## 2021-01-15 ENCOUNTER — Encounter: Payer: Self-pay | Admitting: Nurse Practitioner

## 2021-01-15 ENCOUNTER — Ambulatory Visit: Payer: 59 | Admitting: Nurse Practitioner

## 2021-01-15 ENCOUNTER — Other Ambulatory Visit: Payer: Self-pay

## 2021-01-15 VITALS — BP 112/84 | HR 84 | Temp 98.4°F | Ht 72.5 in | Wt 178.8 lb

## 2021-01-15 DIAGNOSIS — Z716 Tobacco abuse counseling: Secondary | ICD-10-CM

## 2021-01-15 DIAGNOSIS — F17200 Nicotine dependence, unspecified, uncomplicated: Secondary | ICD-10-CM | POA: Diagnosis not present

## 2021-01-15 DIAGNOSIS — E782 Mixed hyperlipidemia: Secondary | ICD-10-CM | POA: Insufficient documentation

## 2021-01-15 DIAGNOSIS — Z23 Encounter for immunization: Secondary | ICD-10-CM

## 2021-01-15 DIAGNOSIS — Z7689 Persons encountering health services in other specified circumstances: Secondary | ICD-10-CM

## 2021-01-15 DIAGNOSIS — Z1211 Encounter for screening for malignant neoplasm of colon: Secondary | ICD-10-CM

## 2021-01-15 NOTE — Patient Instructions (Signed)
Lipid Profile Test Why am I having this test? The lipid profile test can be used to help evaluate your risk for developing heart disease. The test is also used to monitor your levels during treatmentfor high cholesterol to see if you are reaching your goals. What is being tested? A lipid profile measures the following: Total cholesterol. Cholesterol is a waxy, fat-like substance in your blood. If your total cholesterol level is high, this can increase your risk for heart disease. High-density lipoprotein (HDL). This is known as the good cholesterol. Having high levels of HDL decreases your risk for heart disease. Your HDL level may be low if you smoke or do not get enough exercise. Low-density lipoprotein (LDL). This is known as the bad cholesterol. This type causes plaque to build up in your arteries. Having a low level of LDL is best. Having high levels of LDL increases your risk for heart disease. Cholesterol to HDL ratio. This is calculated by dividing your total cholesterol by your HDL cholesterol. The ratio is used by health care providers to determine your risk for heart disease. A low ratio is best. Triglycerides. These are fats that your body can store or burn for energy. Low levels are best. Having high levels of triglycerides increases your risk for heart disease. What kind of sample is taken?  A blood sample is required for this test. It is usually collected by insertinga needle into a blood vessel. How do I prepare for this test? Do not drink alcohol starting at least 24 hours before your test. Follow any instructions from your health care provider about dietaryrestrictions before your test. Do not eat or drink anything other than water after midnight on the night before thetest, or as told by your health care provider. Tell a health care provider about: All medicines you are taking, including vitamins, herbs, eye drops, creams, and over-the-counter medicines. Any medical conditions  you have. Whether you are pregnant or may be pregnant. How are the results reported? Your test results will be reported as values that indicate your cholesterol and triglyceride levels. Your health care provider will compare your results to normal ranges that were established after testing a large group of people (reference ranges). Reference ranges may vary among labs and hospitals. For this test, commonreference ranges are: Total cholesterol Adult or elderly: less than 200 mg/dL. Child: 120-200 mg/dL. Infant: 70-175 mg/dL. Newborn: 53-135 mg/dL. HDL Male: greater than 45 mg/dL. Male: greater than 55 mg/dL. HDL reference values based on your risk for heart disease: Low risk for heart disease: Male: 60 mg/dL. Male: 70 mg/dL. Moderate risk for heart disease: Male: 45 mg/dL. Male: 55 mg/dL. High risk for heart disease: Male: 25 mg/dL. Male: 35 mg/dL. LDL Adults: Your health care provider will determine a target level for LDL based on your risk for heart disease. If you are at low risk, your LDL should be 130 mg/dL or less. If you are at moderate risk, your LDL should be 100 mg/dL or less. If you are at high risk, your LDL should be 70 mg/dL or less. Children: less than 110 mg/dL. Cholesterol to HDL ratio Reference values based on your risk for heart disease: Risk that is half the average risk: Male: 3.4. Male: 3.3. Average risk: Male: 5.0. Male: 4.4. Risk that is two times average (moderate risk): Male: 10.0. Male: 7.0. Risk that is three times average (high risk): Male: 24.0. Male: 11.0. Triglycerides Adult or elderly: Male: 40-160 mg/dL. Male: 35-135 mg/dL. Children 16-19 years  old: Male: 40-163 mg/dL. Male: 40-128 mg/dL. Children 77-71 years old: Male: 36-138 mg/dL. Male: 41-138 mg/dL. Children 39-44 years old: Male: 31-108 mg/dL. Male: 35-114 mg/dL. Children 12-73 years old: Male: 30-86 mg/dL. Male: 32-99 mg/dL. Triglycerides should  be less than 400 mg/dL even when you are not fasting. What do the results mean? Results that are within the reference ranges are considered normal. Total cholesterol, LDL, and triglyceride levels that are higher than the reference ranges can mean that you have an increased risk for heart disease. An HDL levelthat is lower than the reference range can also indicate an increased risk. Talk with your health care provider about what your results mean. Questions to ask your health care provider Ask your health care provider, or the department that is doing the test: When will my results be ready? How will I get my results? What are my treatment options? What other tests do I need? What are my next steps? Summary The lipid profile test can be used to help predict the likelihood that you will develop heart disease. It can also help monitor your cholesterol levels during treatment. A lipid profile measures your total cholesterol, high-density lipoprotein (HDL), low-density lipoprotein (LDL), cholesterol to HDL ratio, and triglycerides. Total cholesterol, LDL, and triglyceride levels that are higher than the reference ranges can indicate an increased risk for heart disease. An HDL level that is lower than the reference range can indicate an increased risk for heart disease. Talk with your health care provider about what your results mean. This information is not intended to replace advice given to you by your health care provider. Make sure you discuss any questions you have with your healthcare provider. Document Revised: 09/19/2019 Document Reviewed: 09/19/2019 Elsevier Patient Education  [place].

## 2021-01-15 NOTE — Addendum Note (Signed)
Addended by: Noemi Chapel A on: 01/15/2021 11:07 AM   Modules accepted: Orders

## 2021-01-26 DIAGNOSIS — Z1211 Encounter for screening for malignant neoplasm of colon: Secondary | ICD-10-CM | POA: Diagnosis not present

## 2021-02-01 LAB — COLOGUARD: Cologuard: NEGATIVE

## 2021-02-18 ENCOUNTER — Ambulatory Visit: Payer: 59

## 2021-02-20 ENCOUNTER — Ambulatory Visit: Payer: 59

## 2021-06-05 ENCOUNTER — Other Ambulatory Visit: Payer: Self-pay

## 2021-06-05 ENCOUNTER — Emergency Department (HOSPITAL_COMMUNITY): Payer: 59

## 2021-06-05 ENCOUNTER — Emergency Department (HOSPITAL_COMMUNITY)
Admission: EM | Admit: 2021-06-05 | Discharge: 2021-06-06 | Disposition: A | Discharge status: Home / Self Care | Payer: 59 | Attending: Emergency Medicine | Admitting: Emergency Medicine

## 2021-06-05 DIAGNOSIS — J189 Pneumonia, unspecified organism: Secondary | ICD-10-CM | POA: Diagnosis not present

## 2021-06-05 DIAGNOSIS — Z20822 Contact with and (suspected) exposure to covid-19: Secondary | ICD-10-CM | POA: Diagnosis not present

## 2021-06-05 DIAGNOSIS — J181 Lobar pneumonia, unspecified organism: Secondary | ICD-10-CM | POA: Insufficient documentation

## 2021-06-05 DIAGNOSIS — F17210 Nicotine dependence, cigarettes, uncomplicated: Secondary | ICD-10-CM | POA: Insufficient documentation

## 2021-06-05 DIAGNOSIS — F17200 Nicotine dependence, unspecified, uncomplicated: Secondary | ICD-10-CM

## 2021-06-05 DIAGNOSIS — R059 Cough, unspecified: Secondary | ICD-10-CM | POA: Diagnosis not present

## 2021-06-05 DIAGNOSIS — R0602 Shortness of breath: Secondary | ICD-10-CM | POA: Diagnosis not present

## 2021-06-05 LAB — RESP PANEL BY RT-PCR (FLU A&B, COVID) ARPGX2
Influenza A by PCR: NEGATIVE
Influenza B by PCR: NEGATIVE
SARS Coronavirus 2 by RT PCR: NEGATIVE

## 2021-06-05 NOTE — ED Triage Notes (Signed)
Pt here for persistent cough after having URI symptoms since last Friday. Pt has had increased lethargy, decrease PO intake, pt states he feels dehydrated. Pt's been having productive cough w/ white mucus

## 2021-06-05 NOTE — ED Provider Notes (Signed)
Emergency Medicine Provider Triage Evaluation Note  Glendon Dunwoody , a 55 y.o. male  was evaluated in triage.  Pt complains of ongoing cough after URI for one week. Pain in chest with cough. SOB with cough. 1PPD cigarette smoking. No hx asthma, COPD. No chest pain, SOB at rest. No NVD.  Review of Systems  Positive: As above Negative: As above  Physical Exam  BP (!) 144/99 (BP Location: Left Arm)    Pulse 79    Temp 98.3 F (36.8 C) (Oral)    Resp 20    SpO2 98%  Gen:   Awake, no distress   Resp:  Normal effort  MSK:   Moves extremities without difficulty  Other:  Clear breath sounds t/o  Medical Decision Making  Medically screening exam initiated at 9:07 PM.  Appropriate orders placed.  Camren Lipsett was informed that the remainder of the evaluation will be completed by another provider, this initial triage assessment does not replace that evaluation, and the importance of remaining in the ED until their evaluation is complete.  cough   Dorien Chihuahua 06/05/21 2108    Noemi Chapel, MD 06/05/21 [phone]

## 2021-06-06 DIAGNOSIS — Z20822 Contact with and (suspected) exposure to covid-19: Secondary | ICD-10-CM | POA: Diagnosis not present

## 2021-06-06 DIAGNOSIS — J181 Lobar pneumonia, unspecified organism: Secondary | ICD-10-CM | POA: Diagnosis not present

## 2021-06-06 DIAGNOSIS — F17210 Nicotine dependence, cigarettes, uncomplicated: Secondary | ICD-10-CM | POA: Diagnosis not present

## 2021-06-06 MED ORDER — DOXYCYCLINE HYCLATE 100 MG PO TABS
100.0000 mg | ORAL_TABLET | Freq: Once | ORAL | Status: AC
  Administered 2021-06-06: 04:00:00 100 mg via ORAL
  Filled 2021-06-06: qty 1

## 2021-06-06 MED ORDER — ALBUTEROL SULFATE HFA 108 (90 BASE) MCG/ACT IN AERS
4.0000 | INHALATION_SPRAY | Freq: Once | RESPIRATORY_TRACT | Status: AC
  Administered 2021-06-06: 04:00:00 4 via RESPIRATORY_TRACT
  Filled 2021-06-06: qty 6.7

## 2021-06-06 MED ORDER — AEROCHAMBER PLUS FLO-VU LARGE MISC
1.0000 | Freq: Once | Status: AC
  Administered 2021-06-06: 04:00:00 1

## 2021-06-06 MED ORDER — DOXYCYCLINE HYCLATE 100 MG PO CAPS: 100.0000 mg | ORAL_CAPSULE | Freq: Two times a day (BID) | ORAL | 0 refills | Status: AC

## 2021-06-06 NOTE — ED Notes (Signed)
Pt discharged and ambulated out of the ED without difficulty. 

## 2021-06-06 NOTE — ED Provider Notes (Signed)
[facility] EMERGENCY DEPARTMENT Provider Note  CSN: [id number] Arrival date & time: 06/05/21 2023  Chief Complaint(s) Cough  HPI Cameron Howell is a 55 y.o. male longtime smoker here for persistent cough.  Patient had an upper respiratory infection 1 to 2 weeks ago that other family members had.  Everybody has improved however he has a persistent cough.  He is having left-sided chest wall pain due to his coughing.  Only felt with cough..  Denies any fevers.  No chills.  He does have shortness of breath with coughing.  No emesis.  No other physical complaints.  The history is provided by the patient.   Past Medical History Past Medical History:  Diagnosis Date   Achilles tendon rupture 05/29/2013   Patient Active Problem List   Diagnosis Date Noted   Mixed hyperlipidemia 01/15/2021   Episodic cluster headache, not intractable 08/26/2020   ED (erectile dysfunction) 07/22/2020   Tobacco use disorder 05/29/2013   Home Medication(s) Prior to Admission medications   Medication Sig Start Date End Date Taking? Authorizing Provider  doxycycline (VIBRAMYCIN) 100 MG capsule Take 1 capsule (100 mg total) by mouth 2 (two) times daily for 10 days. 06/06/21 06/16/21 Yes Elma Shands, Grayce Sessions, MD  clotrimazole (LOTRIMIN) 1 % cream Apply 1 application topically 2 (two) times daily. 06/12/19   Renita Papa, PA-C                                                                                                                                    Past Surgical History Past Surgical History:  Procedure Laterality Date   EYE MUSCLE SURGERY     Family History Family History  Problem Relation Age of Onset   Cancer Mother    Diabetes Mother    Hypertension Mother    Cancer Father    Hypertension Father    Colon cancer Father     Social History Social History   Tobacco Use   Smoking status: Every Day    Packs/day: 1.00    Years: 20.00    Pack years: 20.00    Types: Cigarettes    Smokeless tobacco: Never   Tobacco comments:    20 years smoking  Vaping Use   Vaping Use: Never used  Substance Use Topics   Alcohol use: Yes    Alcohol/week: 0.0 standard drinks    Comment: socially   Drug use: No   Allergies Patient has no known allergies.  Review of Systems Review of Systems All other systems are reviewed and are negative for acute change except as noted in the HPI  Physical Exam Vital Signs  I have reviewed the triage vital signs BP (!) 144/99 (BP Location: Left Arm)    Pulse 79    Temp 98.3 F (36.8 C) (Oral)    Resp 20    SpO2 98%   Physical Exam Vitals reviewed.  Constitutional:  General: He is not in acute distress.    Appearance: He is well-developed. He is not diaphoretic.  HENT:     Head: Normocephalic and atraumatic.     Nose: Nose normal.  Eyes:     General: No scleral icterus.       Right eye: No discharge.        Left eye: No discharge.     Conjunctiva/sclera: Conjunctivae normal.     Pupils: Pupils are equal, round, and reactive to light.  Cardiovascular:     Rate and Rhythm: Normal rate and regular rhythm.     Heart sounds: No murmur heard.   No friction rub. No gallop.  Pulmonary:     Effort: Pulmonary effort is normal. No respiratory distress.     Breath sounds: No stridor. Examination of the right-upper field reveals rhonchi. Examination of the left-middle field reveals rhonchi. Rhonchi (cleared with coughing) present. No rales.  Abdominal:     General: There is no distension.     Palpations: Abdomen is soft.     Tenderness: There is no abdominal tenderness.  Musculoskeletal:        General: No tenderness.     Cervical back: Normal range of motion and neck supple.  Skin:    General: Skin is warm and dry.     Findings: No erythema or rash.  Neurological:     Mental Status: He is alert and oriented to person, place, and time.    ED Results and Treatments Labs (all labs ordered are listed, but only abnormal results  are displayed) Labs Reviewed  RESP PANEL BY RT-PCR (FLU A&B, COVID) ARPGX2                                                                                                                         EKG  EKG Interpretation  Date/Time:    Ventricular Rate:    PR Interval:    QRS Duration:   QT Interval:    QTC Calculation:   R Axis:     Text Interpretation:         Radiology DG Chest 2 View  Result Date: 06/05/2021 CLINICAL DATA:  Cough short of breath EXAM: CHEST - 2 VIEW COMPARISON:  None. FINDINGS: Left lower lobe pneumonia. No pleural effusion. Normal cardiac size. No pneumothorax IMPRESSION: Left lower lobe pneumonia. Radiographic follow-up to resolution is recommended Electronically Signed   By: Donavan Foil M.D.   On: 06/05/2021 21:37    Pertinent labs & imaging results that were available during my care of the patient were reviewed by me and considered in my medical decision making (see MDM for details).  Medications Ordered in ED Medications  albuterol (VENTOLIN HFA) 108 (90 Base) MCG/ACT inhaler 4 puff (has no administration in time range)  doxycycline (VIBRA-TABS) tablet 100 mg (has no administration in time range)  AeroChamber Plus Flo-Vu Large MISC 1 each (has no administration in time range)  Procedures Procedures  (including critical care time)  Medical Decision Making / ED Course I have reviewed the nursing notes for this encounter and the patient's prior records (if available in EHR or on provided paperwork).  Cameron Howell was evaluated in Emergency Department on 06/06/2021 for the symptoms described in the history of present illness. He was evaluated in the context of the global COVID-19 pandemic, which necessitated consideration that the patient might be at risk for infection with the SARS-CoV-2 virus that causes COVID-19.  Institutional protocols and algorithms that pertain to the evaluation of patients at risk for COVID-19 are in a state of rapid change based on information released by regulatory bodies including the CDC and federal and state organizations. These policies and algorithms were followed during the patient's care in the ED.     Persistent cough following viral URI. COVID/influenza negative. Chest x-ray with evidence of left lower lobe pneumonia. Patient is afebrile with stable vital signs. No respiratory distress. Appropriate for outpatient management.  Pertinent labs & imaging results that were available during my care of the patient were reviewed by me and considered in my medical decision making:  Given her longtime smoking, will provide patient with albuterol inhaler. Will treat with doxycycline.  Final Clinical Impression(s) / ED Diagnoses Final diagnoses:  Pneumonia of left lower lobe due to infectious organism  Smoker   The patient appears reasonably screened and/or stabilized for discharge and I doubt any other medical condition or other [facility] requiring further screening, evaluation, or treatment in the ED at this time prior to discharge. Safe for discharge with strict return precautions.  Disposition: Discharge  Condition: Good  I have discussed the results, Dx and Tx plan with the patient/family who expressed understanding and agree(s) with the plan. Discharge instructions discussed at length. The patient/family was given strict return precautions who verbalized understanding of the instructions. No further questions at time of discharge.    ED Discharge Orders          Ordered    doxycycline (VIBRAMYCIN) 100 MG capsule  2 times daily        06/06/21 0333             Follow Up: Eulogio Bear, NP [place] [address] [phone]  Call  to schedule an appointment for close follow up     This chart was dictated using voice recognition  software.  Despite best efforts to proofread,  errors can occur which can change the documentation meaning.    Fatima Blank, MD 06/06/21 [phone]

## 2021-06-06 NOTE — Discharge Instructions (Addendum)
You may take over-the-counter medicine for symptomatic relief, such as Tylenol, Motrin, TheraFlu, Alka seltzer , black elderberry, etc. Please limit acetaminophen (Tylenol) to 4000 mg and Ibuprofen (Motrin, Advil, etc.) to 2400 mg for a 24hr period. Please note that other over-the-counter medicine may contain acetaminophen or ibuprofen as a component of their ingredients.   Albuterol inhaler: Take 1 to 2 puffs every 4-6 hours as needed for shortness of breath.

## 2021-09-01 ENCOUNTER — Ambulatory Visit (INDEPENDENT_AMBULATORY_CARE_PROVIDER_SITE_OTHER): Payer: 59 | Admitting: Primary Care

## 2021-09-01 ENCOUNTER — Encounter (INDEPENDENT_AMBULATORY_CARE_PROVIDER_SITE_OTHER): Payer: Self-pay | Admitting: Primary Care

## 2021-09-01 ENCOUNTER — Other Ambulatory Visit: Payer: Self-pay

## 2021-09-01 VITALS — BP 140/97 | HR 83 | Temp 98.3°F | Ht 77.0 in | Wt 179.6 lb

## 2021-09-01 DIAGNOSIS — R030 Elevated blood-pressure reading, without diagnosis of hypertension: Secondary | ICD-10-CM | POA: Diagnosis not present

## 2021-09-01 DIAGNOSIS — Z131 Encounter for screening for diabetes mellitus: Secondary | ICD-10-CM | POA: Diagnosis not present

## 2021-09-01 DIAGNOSIS — Z7689 Persons encountering health services in other specified circumstances: Secondary | ICD-10-CM | POA: Diagnosis not present

## 2021-09-01 DIAGNOSIS — F17210 Nicotine dependence, cigarettes, uncomplicated: Secondary | ICD-10-CM

## 2021-09-01 DIAGNOSIS — H6123 Impacted cerumen, bilateral: Secondary | ICD-10-CM | POA: Diagnosis not present

## 2021-09-01 DIAGNOSIS — R7303 Prediabetes: Secondary | ICD-10-CM | POA: Diagnosis not present

## 2021-09-01 DIAGNOSIS — E782 Mixed hyperlipidemia: Secondary | ICD-10-CM | POA: Diagnosis not present

## 2021-09-01 DIAGNOSIS — F17200 Nicotine dependence, unspecified, uncomplicated: Secondary | ICD-10-CM

## 2021-09-01 LAB — POCT GLYCOSYLATED HEMOGLOBIN (HGB A1C): Hemoglobin A1C: 5.3 % (ref 4.0–5.6)

## 2021-09-01 NOTE — Progress Notes (Signed)
? ?New Patient Office Visit ? ?Subjective:  ?Patient ID: Cameron Howell, male    DOB: [birth year]  Age: 56 y.o. MRN: [id number] ? ?CC:  ?Chief Complaint  ?Patient presents with  ? New Patient (Initial Visit)  ? ? ?HPI ?Mr. Cameron Howell  is a 56 year old presents for establish care. Patient has No headache, No chest pain, No abdominal pain - No Nausea, No new weakness tingling or numbness, No Cough - shortness of breath ? ?Past Medical History:  ?Diagnosis Date  ? Achilles tendon rupture 05/29/2013  ? ? ?Past Surgical History:  ?Procedure Laterality Date  ? EYE MUSCLE SURGERY    ? ? ?Family History  ?Problem Relation Age of Onset  ? Cancer Mother   ? Diabetes Mother   ? Hypertension Mother   ? Cancer Father   ? Hypertension Father   ? Colon cancer Father   ? ? ?Social History  ? ?Socioeconomic History  ? Marital status: Married  ?  Spouse name: Not on file  ? Number of children: Not on file  ? Years of education: Not on file  ? Highest education level: Not on file  ?Occupational History  ? Not on file  ?Tobacco Use  ? Smoking status: Every Day  ?  Packs/day: 1.00  ?  Years: 20.00  ?  Pack years: 20.00  ?  Types: Cigarettes  ? Smokeless tobacco: Never  ? Tobacco comments:  ?  20 years smoking  ?Vaping Use  ? Vaping Use: Never used  ?Substance and Sexual Activity  ? Alcohol use: Yes  ?  Alcohol/week: 0.0 standard drinks  ?  Comment: socially  ? Drug use: No  ? Sexual activity: Yes  ?  Partners: Female  ?Other Topics Concern  ? Not on file  ?Social History Narrative  ? Not on file  ? ?Social Determinants of Health  ? ?Financial Resource Strain: Not on file  ?Food Insecurity: Not on file  ?Transportation Needs: Not on file  ?Physical Activity: Not on file  ?Stress: Not on file  ?Social Connections: Not on file  ?Intimate Partner Violence: Not on file  ? ? ?ROS ?Comprehensive ROS Pertinent positive and negative noted in HPI   ? ?Objective:  ? ?Today's Vitals: BP (!) 140/97 (BP Location: Right Arm, Patient Position: Sitting, Cuff  Size: Normal)   Pulse 83   Temp 98.3 ?F (36.8 ?C) (Oral)   Ht $R'6\' 5"'so$  (1.956 m)   Wt 179 lb 9.6 oz (81.5 kg)   SpO2 99%   BMI 21.30 kg/m?  ? ?Physical Exam ?Vitals reviewed.  ?Constitutional:   ?   Comments: underweight  ?HENT:  ?   Head: Normocephalic.  ?   Right Ear: External ear normal. There is impacted cerumen.  ?   Left Ear: External ear normal. There is impacted cerumen.  ?   Nose: Nose normal.  ?Eyes:  ?   Extraocular Movements: Extraocular movements intact.  ?   Pupils: Pupils are equal, round, and reactive to light.  ?Cardiovascular:  ?   Rate and Rhythm: Normal rate and regular rhythm.  ?Pulmonary:  ?   Effort: Pulmonary effort is normal.  ?   Breath sounds: Normal breath sounds.  ?Abdominal:  ?   General: Abdomen is flat. Bowel sounds are normal.  ?   Palpations: Abdomen is soft.  ?Musculoskeletal:     ?   General: Normal range of motion.  ?Skin: ?   General: Skin is warm and dry.  ?  Neurological:  ?   Mental Status: He is alert and oriented to person, place, and time.  ?Psychiatric:     ?   Mood and Affect: Mood normal.     ?   Behavior: Behavior normal.     ?   Thought Content: Thought content normal.     ?   Judgment: Judgment normal.  ? ?Assessment & Plan:  ?Cameron Howell was seen today for new patient (initial visit). ? ?Diagnoses and all orders for this visit: ? ?Tobacco use disorder ?- I have recommended complete cessation of tobacco use. I have discussed various options available for assistance with tobacco cessation including over the counter methods (Nicotine gum, patch and lozenges). We also discussed prescription options (Chantix, Nicotine Inhaler / Nasal Spray). The patient is not interested in pursuing any prescription tobacco cessation options at this time. ?- Patient declines at this time.  ? ?Mixed hyperlipidemia ? Healthy lifestyle diet of fruits vegetables fish nuts whole grains and low saturated fat . Foods high in cholesterol or liver, fatty meats,cheese, butter avocados, nuts and seeds,  chocolate and fried foods. ?-     Lipid Panel ? ?Elevated blood pressure reading without diagnosis of hypertension ?We have discussed target BP range and blood pressure goal. I have advised patient to check BP regularly and to call us back or report to clinic if the numbers are consistently higher than 140/90. We discussed the importance of compliance with medical therapy and DASH diet recommended, consequences of uncontrolled hypertension discussed.  ?- continue current BP medications  ?-     CMP14+EGFR ? ?Screening for diabetes mellitus (DM) ?-     CBC with Differential ?-     HgB A1c 5.3 ? ?Bilateral impacted cerumen ?return for ear irrigation ?  ?Outpatient Encounter Medications as of 09/01/2021  ?Medication Sig  ? clotrimazole (LOTRIMIN) 1 % cream Apply 1 application topically 2 (two) times daily.  ? ?No facility-administered encounter medications on file as of 09/01/2021.  ? ? ?Follow-up: Return in about 2 weeks (around 09/15/2021) for ear irrigation.  ? ?Kerin Perna, NP ? ?

## 2021-09-01 NOTE — Patient Instructions (Signed)

## 2021-09-02 LAB — CBC WITH DIFFERENTIAL/PLATELET
Basophils Absolute: 0.1 10*3/uL (ref 0.0–0.2)
Basos: 2 %
EOS (ABSOLUTE): 0.2 10*3/uL (ref 0.0–0.4)
Eos: 6 %
Hematocrit: 49.4 % (ref 37.5–51.0)
Hemoglobin: 17 g/dL (ref 13.0–17.7)
Immature Grans (Abs): 0 10*3/uL (ref 0.0–0.1)
Immature Granulocytes: 0 %
Lymphocytes Absolute: 1.3 10*3/uL (ref 0.7–3.1)
Lymphs: 41 %
MCH: 33.3 pg — ABNORMAL HIGH (ref 26.6–33.0)
MCHC: 34.4 g/dL (ref 31.5–35.7)
MCV: 97 fL (ref 79–97)
Monocytes Absolute: 0.3 10*3/uL (ref 0.1–0.9)
Monocytes: 11 %
Neutrophils Absolute: 1.2 10*3/uL — ABNORMAL LOW (ref 1.4–7.0)
Neutrophils: 40 %
Platelets: 217 10*3/uL (ref 150–450)
RBC: 5.1 x10E6/uL (ref 4.14–5.80)
RDW: 13 % (ref 11.6–15.4)
WBC: 3 10*3/uL — ABNORMAL LOW (ref 3.4–10.8)

## 2021-09-02 LAB — CMP14+EGFR
ALT: 20 IU/L (ref 0–44)
AST: 21 IU/L (ref 0–40)
Albumin/Globulin Ratio: 2 (ref 1.2–2.2)
Albumin: 4.9 g/dL (ref 3.8–4.9)
Alkaline Phosphatase: 49 IU/L (ref 44–121)
BUN/Creatinine Ratio: 12 (ref 9–20)
BUN: 11 mg/dL (ref 6–24)
Bilirubin Total: 0.6 mg/dL (ref 0.0–1.2)
CO2: 25 mmol/L (ref 20–29)
Calcium: 9.6 mg/dL (ref 8.7–10.2)
Chloride: 102 mmol/L (ref 96–106)
Creatinine, Ser: 0.92 mg/dL (ref 0.76–1.27)
Globulin, Total: 2.4 g/dL (ref 1.5–4.5)
Glucose: 94 mg/dL (ref 70–99)
Potassium: 4.9 mmol/L (ref 3.5–5.2)
Sodium: 141 mmol/L (ref 134–144)
Total Protein: 7.3 g/dL (ref 6.0–8.5)
eGFR: 98 mL/min/{1.73_m2} (ref >59)

## 2021-09-02 LAB — LIPID PANEL
Chol/HDL Ratio: 4.1 ratio (ref 0.0–5.0)
Cholesterol, Total: 190 mg/dL (ref 100–199)
HDL: 46 mg/dL (ref >39)
LDL Chol Calc (NIH): 123 mg/dL — ABNORMAL HIGH (ref 0–99)
Triglycerides: 115 mg/dL (ref 0–149)
VLDL Cholesterol Cal: 21 mg/dL (ref 5–40)

## 2021-09-22 ENCOUNTER — Other Ambulatory Visit (HOSPITAL_COMMUNITY): Payer: Self-pay

## 2021-09-22 ENCOUNTER — Ambulatory Visit (INDEPENDENT_AMBULATORY_CARE_PROVIDER_SITE_OTHER): Payer: 59 | Admitting: Primary Care

## 2021-09-22 ENCOUNTER — Encounter (INDEPENDENT_AMBULATORY_CARE_PROVIDER_SITE_OTHER): Payer: Self-pay | Admitting: Primary Care

## 2021-09-22 VITALS — BP 164/83 | HR 73 | Temp 98.2°F | Ht 77.0 in | Wt 178.2 lb

## 2021-09-22 DIAGNOSIS — H6122 Impacted cerumen, left ear: Secondary | ICD-10-CM

## 2021-09-22 DIAGNOSIS — F17200 Nicotine dependence, unspecified, uncomplicated: Secondary | ICD-10-CM

## 2021-09-22 DIAGNOSIS — I10 Essential (primary) hypertension: Secondary | ICD-10-CM

## 2021-09-22 MED ORDER — AMLODIPINE BESYLATE 10 MG PO TABS
10.0000 mg | ORAL_TABLET | Freq: Every day | ORAL | 1 refills | Status: DC
  Filled 2021-09-22: qty 90, 90d supply, fill #0

## 2021-09-22 NOTE — Patient Instructions (Signed)
Amlodipine Tablets ?What is this medication? ?AMLODIPINE (am LOE di peen) treats high blood pressure and prevents chest pain (angina). It works by relaxing the blood vessels, which helps decrease the amount of work your heart has to do. It belongs to a group of medications called calcium channel blockers. ?This medicine may be used for other purposes; ask your health care provider or pharmacist if you have questions. ?COMMON BRAND NAME(S): Norvasc ?What should I tell my care team before I take this medication? ?They need to know if you have any of these conditions: ?Heart disease ?Liver disease ?An unusual or allergic reaction to amlodipine, other medications, foods, dyes, or preservatives ?Pregnant or trying to get pregnant ?Breast-feeding ?How should I use this medication? ?Take this medication by mouth. Take it as directed on the prescription label at the same time every day. You can take it with or without food. If it upsets your stomach, take it with food. Keep taking it unless your care team tells you to stop. ?Talk to your care team about the use of this medication in children. While it may be prescribed for children as young as 6 for selected conditions, precautions do apply. ?Overdosage: If you think you have taken too much of this medicine contact a poison control center or emergency room at once. ?NOTE: This medicine is only for you. Do not share this medicine with others. ?What if I miss a dose? ?If you miss a dose, take it as soon as you can. If it is almost time for your next dose, take only that dose. Do not take double or extra doses. ?What may interact with this medication? ?Clarithromycin ?Cyclosporine ?Diltiazem ?Itraconazole ?Simvastatin ?Tacrolimus ?This list may not describe all possible interactions. Give your health care provider a list of all the medicines, herbs, non-prescription drugs, or dietary supplements you use. Also tell them if you smoke, drink alcohol, or use illegal drugs. Some  items may interact with your medicine. ?What should I watch for while using this medication? ?Visit your health care provider for regular checks on your progress. Check your blood pressure as directed. Ask your health care provider what your blood pressure should be. Also, find out when you should contact him or her. ?Do not treat yourself for coughs, colds, or pain while you are using this medication without asking your health care provider for advice. Some medications may increase your blood pressure. ?You may get drowsy or dizzy. Do not drive, use machinery, or do anything that needs mental alertness until you know how this medication affects you. Do not stand up or sit up quickly, especially if you are an older patient. This reduces the risk of dizzy or fainting spells. Alcohol can make you more drowsy and dizzy. Avoid alcoholic drinks. ?What side effects may I notice from receiving this medication? ?Side effects that you should report to your care team as soon as possible: ?Allergic reactions--skin rash, itching, hives, swelling of the face, lips, tongue, or throat ?Heart attack--pain or tightness in the chest, shoulders, arms, or jaw, nausea, shortness of breath, cold or clammy skin, feeling faint or lightheaded ?Low blood pressure--dizziness, feeling faint or lightheaded, blurry vision ?Side effects that usually do not require medical attention (report these to your care team if they continue or are bothersome): ?Facial flushing, redness ?Heart palpitations--rapid, pounding, or irregular heartbeat ?Nausea ?Stomach pain ?Swelling of the ankles, hands, or feet ?This list may not describe all possible side effects. Call your doctor for medical advice about side  effects. You may report side effects to FDA at 1-800-FDA-1088. ?Where should I keep my medication? ?Keep out of the reach of children and pets. ?Store at room temperature between 20 and 25 degrees C (68 and 77 degrees F). Protect from light and moisture.  Keep the container tightly closed. Get rid of any unused medication after the expiration date. ?To get rid of medications that are no longer needed or have expired: ?Take the medication to a medication take-back program. Check with your pharmacy or law enforcement to find a location. ?If you cannot return the medication, check the label or package insert to see if the medication should be thrown out in the garbage or flushed down the toilet. If you are not sure, ask your health care provider. If it is safe to put in the trash, empty the medication out of the container. Mix the medication with cat litter, dirt, coffee grounds, or other unwanted substance. Seal the mixture in a bag or container. Put it in the trash. ?NOTE: This sheet is a summary. It may not cover all possible information. If you have questions about this medicine, talk to your doctor, pharmacist, or health care provider. ?? 2022 Elsevier/Gold Standard (2021-02-17 00:00:00) ? ?

## 2021-09-22 NOTE — Progress Notes (Signed)
?[place] ? ?Cameron Howell, is a 56 y.o. male ? ?PIR:[id number] ? ?YTK:[id number] ? ?DOB - 10-09-1965 ? ?Chief Complaint  ?Patient presents with  ? Cerumen Impaction  ?    ? ?Subjective:  ? ?Cameron Howell is a 56 y.o. male here today for left cerumen impaction. Patient has No headache, No chest pain, No abdominal pain - No Nausea, No new weakness tingling or numbness, No Cough - shortness of breath ? ?No problems updated. ? ?No Known Allergies ? ?Past Medical History:  ?Diagnosis Date  ? Achilles tendon rupture 05/29/2013  ? ? ?No current outpatient medications on file prior to visit.  ? ?No current facility-administered medications on file prior to visit.  ? ? ?Objective:  ? ?Vitals:  ? 09/22/21 1552 09/22/21 1608  ?BP: (!) 152/97 (!) 164/83  ?Pulse: 75 73  ?Temp: 98.2 ?F (36.8 ?C)   ?TempSrc: Oral   ?SpO2: 97%   ?Weight: 178 lb 3.2 oz (80.8 kg)   ?Height: '6\' 5"'$  (1.956 m)   ? ?Comprehensive ROS Pertinent positive and negative noted in HPI   ?Exam ?General appearance : Awake, alert, not in any distress. Speech Clear. Not toxic looking ?HEENT: Atraumatic and Normocephalic, pupils equally reactive to light and accomodation ?Neck: Supple, no JVD. No cervical lymphadenopathy.  ?Chest: Good air entry bilaterally, no added sounds  ?CVS: S1 S2 regular, no murmurs.  ?Abdomen: Bowel sounds present, Non tender and not distended with no gaurding, rigidity or rebound. ?Extremities: B/L Lower Ext shows no edema, both legs are warm to touch ?Neurology: Awake alert, and oriented X 3, CN II-XII intact, Non focal ?Skin: No Rash ? ?Data Review ?Lab Results  ?Component Value Date  ? HGBA1C 5.3 09/01/2021  ? HGBA1C 5.9 09/04/2020  ? HGBA1C 6.1 05/24/2013  ? ? ?Assessment & Plan  ?Cameron Howell was seen today for cerumen impaction. ? ?Diagnoses and all orders for this visit: ? ?Impacted cerumen of left ear ?The patient had a large amount of cerumen in the external auditory canal(s): left ?Ear wax softener was used prior to the  lavage. ?Ear lavage was performed on left ear ?Curettage by provider was not performed in addition.  ?There were no complications and following the disimpaction the tympanic membrane was visible. ?Charges to be entered in Charge Capture section. ? ?Tobacco use disorder ?- I have recommended complete cessation of tobacco use. I have discussed various options available for assistance with tobacco cessation including over the counter methods (Nicotine gum, patch and lozenges). We also discussed prescription options (Chantix, Nicotine Inhaler / Nasal Spray). The patient is not interested in pursuing any prescription tobacco cessation options at this time. ?- Patient declines at this time.  ? ?Essential hypertension ?Started with stopping salt in diet, and decreased alcohol consumption. Still discussing smoking cessation.  ?BP goal - < 130/80 ?Explained that having normal blood pressure is the goal and medications are helping to get to goal and maintain normal blood pressure. ?DIET: Limit salt intake, read nutrition labels to check salt content, limit fried and high fatty foods  ?Avoid using multisymptom OTC cold preparations that generally contain sudafed which can rise BP. Consult with pharmacist on best cold relief products to use for persons with HTN ?EXERCISE ?Discussed incorporating exercise such as walking - 30 minutes most days of the week and can do in 10 minute intervals    ?-     amLODipine (NORVASC) 10 MG tablet; Take 1 tablet (10 mg total) by mouth daily. ? ?Patient have been  counseled extensively about nutrition and exercise. Other issues discussed during this visit include: low cholesterol diet, weight control and daily exercise, foot care, annual eye examinations at Ophthalmology, importance of adherence with medications and regular follow-up. We also discussed long term complications of uncontrolled diabetes and hypertension.  ? ?Return in about 7 weeks (around 11/10/2021) for Bp. ? ?The patient was given  clear instructions to go to ER or return to medical center if symptoms don't improve, worsen or new problems develop. The patient verbalized understanding. The patient was told to call to get lab results if they haven't heard anything in the next week.  ? ?This note has been created with Surveyor, quantity. Any transcriptional errors are unintentional.  ? ?Cameron Perna, NP ?09/22/2021, 4:29 PM  ?

## 2021-09-24 ENCOUNTER — Other Ambulatory Visit (HOSPITAL_COMMUNITY): Payer: Self-pay

## 2021-11-10 ENCOUNTER — Ambulatory Visit (INDEPENDENT_AMBULATORY_CARE_PROVIDER_SITE_OTHER): Payer: 59 | Admitting: Primary Care

## 2021-11-25 ENCOUNTER — Ambulatory Visit (INDEPENDENT_AMBULATORY_CARE_PROVIDER_SITE_OTHER): Payer: 59 | Admitting: Primary Care

## 2021-11-25 ENCOUNTER — Encounter (INDEPENDENT_AMBULATORY_CARE_PROVIDER_SITE_OTHER): Payer: Self-pay | Admitting: Primary Care

## 2021-11-25 VITALS — BP 128/87 | HR 84 | Temp 98.2°F | Ht 77.0 in | Wt 176.0 lb

## 2021-11-25 DIAGNOSIS — Z8042 Family history of malignant neoplasm of prostate: Secondary | ICD-10-CM

## 2021-11-25 DIAGNOSIS — I10 Essential (primary) hypertension: Secondary | ICD-10-CM | POA: Diagnosis not present

## 2021-11-25 NOTE — Progress Notes (Signed)
Cameron   Lyndal Howell is a 56 y.o. male presents for hypertension evaluation, Denies shortness of breath, headaches, chest pain or lower extremity edema, sudden onset, vision changes, unilateral weakness, dizziness, paresthesias   Patient reports adherence with medications.  Dietary habits include: monitoring sodium intake  Exercise habits include:walking  Family / Social history: None   Past Medical History:  Diagnosis Date   Achilles tendon rupture 05/29/2013   Past Surgical History:  Procedure Laterality Date   EYE MUSCLE SURGERY     No Known Allergies Current Outpatient Medications on File Prior to Visit  Medication Sig Dispense Refill   amLODipine (NORVASC) 10 MG tablet Take 1 tablet (10 mg total) by mouth daily. 90 tablet 1   No current facility-administered medications on file prior to visit.   Social History   Socioeconomic History   Marital status: Married    Spouse name: Not on file   Number of children: Not on file   Years of education: Not on file   Highest education level: Not on file  Occupational History   Not on file  Tobacco Use   Smoking status: Every Day    Packs/day: 1.00    Years: 20.00    Total pack years: 20.00    Types: Cigarettes   Smokeless tobacco: Never   Tobacco comments:    20 years smoking  Vaping Use   Vaping Use: Never used  Substance and Sexual Activity   Alcohol use: Yes    Alcohol/week: 0.0 standard drinks of alcohol    Comment: socially   Drug use: No   Sexual activity: Yes    Partners: Female  Other Topics Concern   Not on file  Social History Narrative   Not on file   Social Determinants of Health   Financial Resource Strain: Not on file  Food Insecurity: Not on file  Transportation Needs: Not on file  Physical Activity: Not on file  Stress: Not on file  Social Connections: Not on file  Intimate Partner Violence: Not on file   Family History  Problem Relation Age of Onset   Cancer Mother     Diabetes Mother    Hypertension Mother    Cancer Father    Hypertension Father    Colon cancer Father      OBJECTIVE:  Vitals:   11/25/21 1521  BP: 128/87  Pulse: 84  Temp: 98.2 F (36.8 C)  TempSrc: Oral  SpO2: 96%  Weight: 176 lb (79.8 kg)  Height: '6\' 5"'$  (1.956 m)    Physical Exam Vitals reviewed.  Constitutional:      Appearance: Normal appearance.  HENT:     Head: Normocephalic.     Right Ear: External ear normal.     Left Ear: External ear normal.     Nose: Nose normal.  Eyes:     Extraocular Movements: Extraocular movements intact.     Pupils: Pupils are equal, round, and reactive to light.  Cardiovascular:     Rate and Rhythm: Normal rate and regular rhythm.  Pulmonary:     Effort: Pulmonary effort is normal.     Breath sounds: Normal breath sounds.  Abdominal:     General: Bowel sounds are normal.     Palpations: Abdomen is soft.  Musculoskeletal:        General: Normal range of motion.     Cervical back: Normal range of motion.  Skin:    General: Skin is warm and dry.  Neurological:  Mental Status: He is alert and oriented to person, place, and time.  Psychiatric:        Mood and Affect: Mood normal.        Behavior: Behavior normal.        Thought Content: Thought content normal.        Judgment: Judgment normal.    ROS Comprehensive ROS Pertinent positive and negative noted in HPI   Last 3 Office BP readings: BP Readings from Last 3 Encounters:  11/25/21 128/87  09/22/21 (!) 164/83  09/01/21 (!) 140/97    BMET    Component Value Date/Time   NA 141 09/01/2021 0937   K 4.9 09/01/2021 0937   CL 102 09/01/2021 0937   CO2 25 09/01/2021 0937   GLUCOSE 94 09/01/2021 0937   GLUCOSE 101 (H) 09/04/2020 1024   BUN 11 09/01/2021 0937   CREATININE 0.92 09/01/2021 0937   CALCIUM 9.6 09/01/2021 0937   GFRNONAA >60 12/27/2014 0200   GFRAA >60 12/27/2014 0200    Renal function: CrCl cannot be calculated (Patient's most recent lab  result is older than the maximum 21 days allowed.).  Clinical ASCVD: Yes  The 10-year ASCVD risk score (Arnett DK, et al., 2019) is: 19.1%   Values used to calculate the score:     Age: 57 years     Sex: Male     Is Non-Hispanic African American: Yes     Diabetic: No     Tobacco smoker: Yes     Systolic Blood Pressure: 882 mmHg     Is BP treated: Yes     HDL Cholesterol: 46 mg/dL     Total Cholesterol: 190 mg/dL  ASCVD risk factors include- [place]   ASSESSMENT & PLAN: Cameron Howell was seen today for hypertension.  Diagnoses and all orders for this visit:  Family history of prostate cancer -     PSA  Essential hypertension -Counseled on lifestyle modifications for blood pressure control including reduced dietary sodium, increased exercise, weight reduction and adequate sleep. Also, educated patient about the risk for cardiovascular events, stroke and heart attack. Also counseled patient about the importance of medication adherence. If you participate in smoking, it is important to stop using tobacco as this will increase the risks associated with uncontrolled blood pressure.   Goal BP:  For patients younger than 60: Goal BP < 130/80. For patients 60 and older: Goal BP < 140/90. For patients with diabetes: Goal BP < 130/80. Your most recent BP: 128/87 Minimize salt intake. Minimize alcohol intake    This note has been created with Surveyor, quantity. Any transcriptional errors are unintentional.   Kerin Perna, NP 11/25/2021, 3:30 PM

## 2021-11-26 LAB — PSA: Prostate Specific Ag, Serum: 0.9 ng/mL (ref 0.0–4.0)

## 2022-03-08 DIAGNOSIS — F19282 Other psychoactive substance dependence with psychoactive substance-induced sleep disorder: Secondary | ICD-10-CM | POA: Diagnosis not present

## 2022-03-08 DIAGNOSIS — F10129 Alcohol abuse with intoxication, unspecified: Secondary | ICD-10-CM | POA: Diagnosis not present

## 2022-03-08 DIAGNOSIS — F1420 Cocaine dependence, uncomplicated: Secondary | ICD-10-CM | POA: Diagnosis not present

## 2022-03-08 DIAGNOSIS — F17210 Nicotine dependence, cigarettes, uncomplicated: Secondary | ICD-10-CM | POA: Diagnosis not present

## 2022-03-08 DIAGNOSIS — Z5900 Homelessness unspecified: Secondary | ICD-10-CM | POA: Diagnosis not present

## 2022-03-08 DIAGNOSIS — I10 Essential (primary) hypertension: Secondary | ICD-10-CM | POA: Diagnosis not present

## 2022-03-08 DIAGNOSIS — F10239 Alcohol dependence with withdrawal, unspecified: Secondary | ICD-10-CM | POA: Diagnosis not present

## 2022-03-08 DIAGNOSIS — F1020 Alcohol dependence, uncomplicated: Secondary | ICD-10-CM | POA: Diagnosis not present

## 2022-05-15 DIAGNOSIS — D320 Benign neoplasm of cerebral meninges: Secondary | ICD-10-CM | POA: Diagnosis not present

## 2022-05-15 DIAGNOSIS — F1090 Alcohol use, unspecified, uncomplicated: Secondary | ICD-10-CM | POA: Diagnosis not present

## 2022-05-15 DIAGNOSIS — R9431 Abnormal electrocardiogram [ECG] [EKG]: Secondary | ICD-10-CM | POA: Diagnosis not present

## 2022-05-15 DIAGNOSIS — I10 Essential (primary) hypertension: Secondary | ICD-10-CM | POA: Diagnosis not present

## 2022-05-15 DIAGNOSIS — F1190 Opioid use, unspecified, uncomplicated: Secondary | ICD-10-CM | POA: Diagnosis not present

## 2022-05-15 DIAGNOSIS — S02642A Fracture of ramus of left mandible, initial encounter for closed fracture: Secondary | ICD-10-CM | POA: Diagnosis not present

## 2022-05-15 DIAGNOSIS — F1490 Cocaine use, unspecified, uncomplicated: Secondary | ICD-10-CM | POA: Diagnosis not present

## 2022-05-15 DIAGNOSIS — U071 COVID-19: Secondary | ICD-10-CM | POA: Diagnosis not present

## 2022-05-15 DIAGNOSIS — R0989 Other specified symptoms and signs involving the circulatory and respiratory systems: Secondary | ICD-10-CM | POA: Diagnosis not present

## 2022-05-15 DIAGNOSIS — F10920 Alcohol use, unspecified with intoxication, uncomplicated: Secondary | ICD-10-CM | POA: Diagnosis not present

## 2022-05-15 DIAGNOSIS — R200 Anesthesia of skin: Secondary | ICD-10-CM | POA: Diagnosis not present

## 2022-05-15 DIAGNOSIS — F14920 Cocaine use, unspecified with intoxication, uncomplicated: Secondary | ICD-10-CM | POA: Diagnosis not present

## 2022-05-15 DIAGNOSIS — R55 Syncope and collapse: Secondary | ICD-10-CM | POA: Diagnosis not present

## 2022-05-27 ENCOUNTER — Ambulatory Visit (INDEPENDENT_AMBULATORY_CARE_PROVIDER_SITE_OTHER): Payer: 59 | Admitting: Primary Care

## 2022-08-08 IMAGING — DX DG CHEST 2V
2 series · 2 of 2 positions shown · non-contrast
Comparison: None.

CLINICAL DATA: Cough short of breath

EXAM:
CHEST - 2 VIEW

[chest pa]
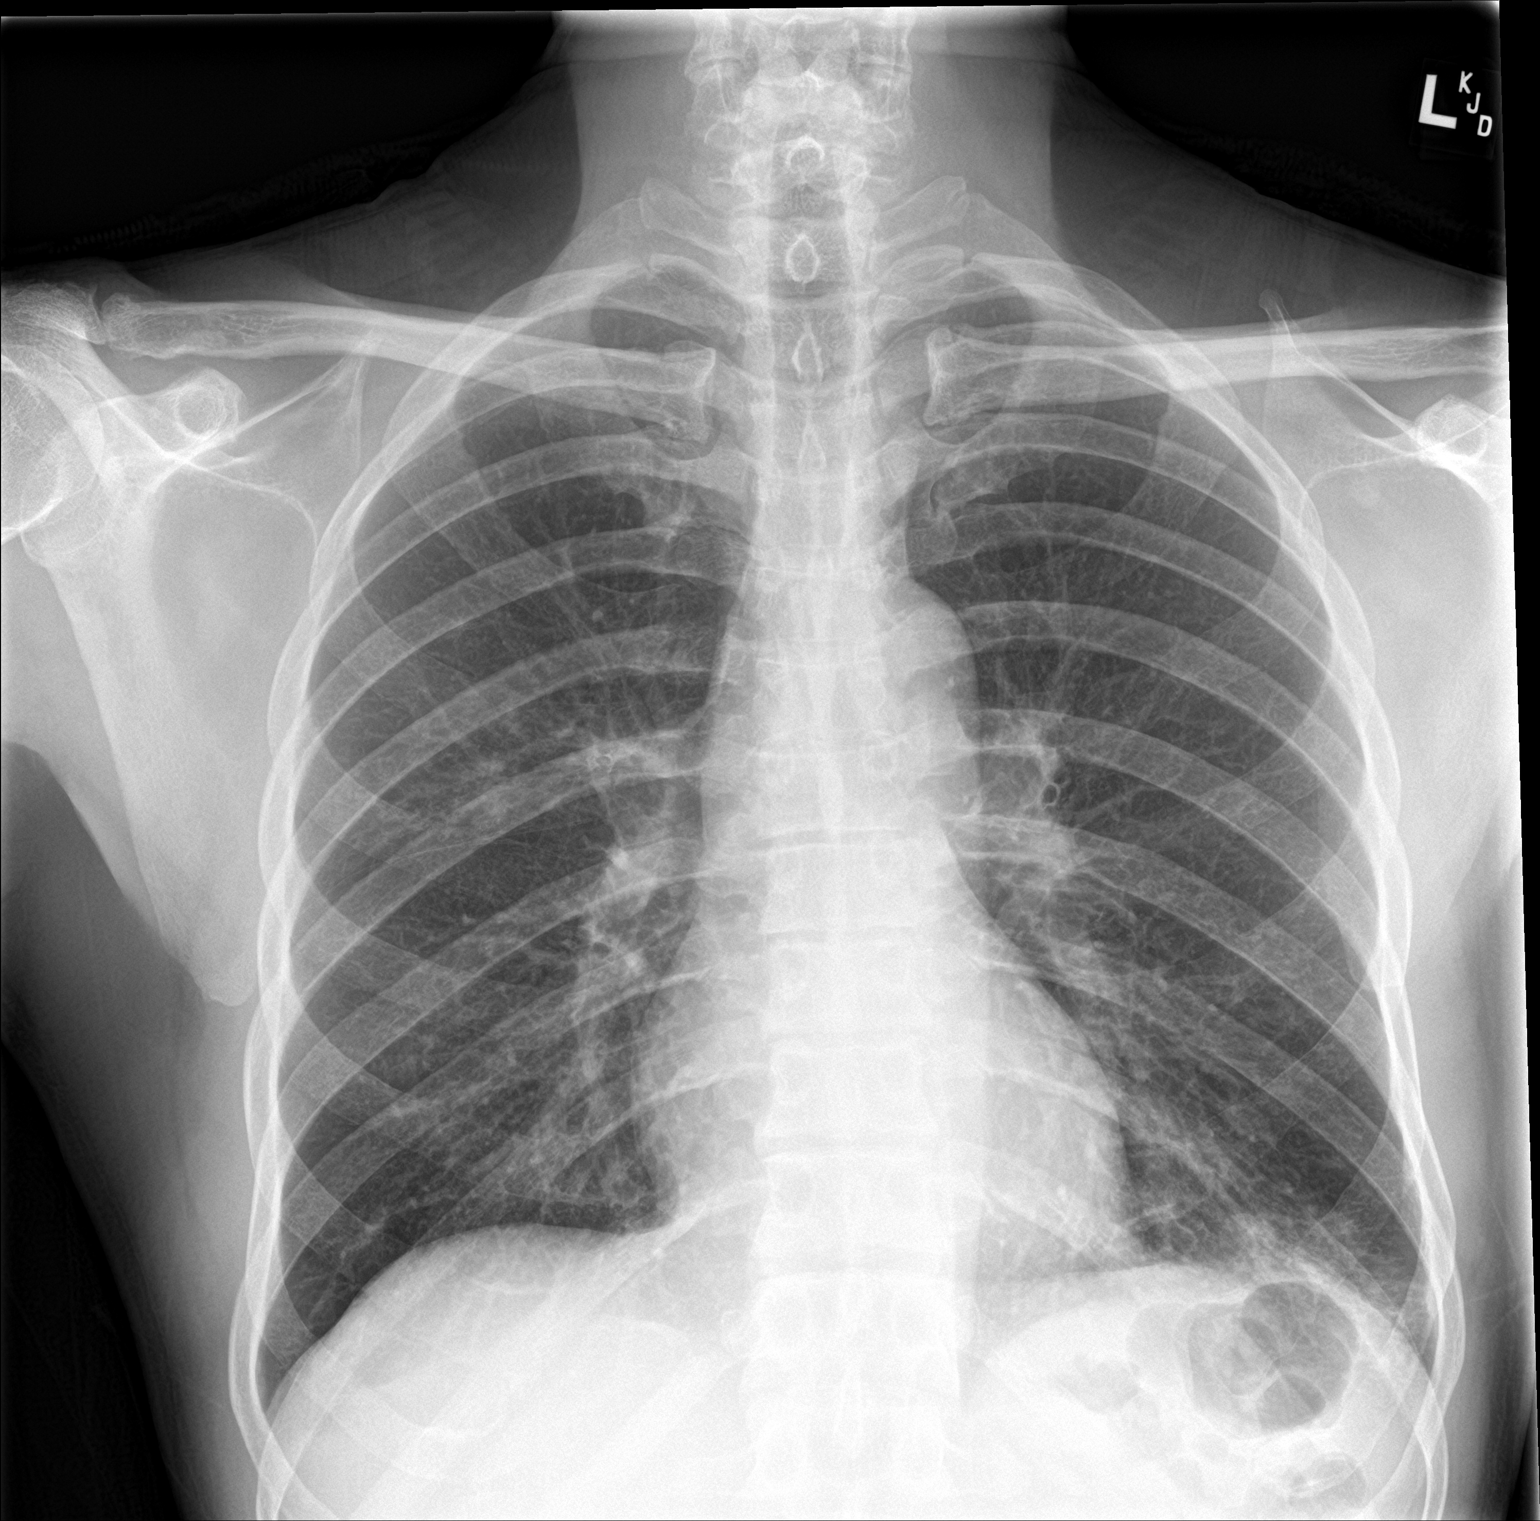

[chest lat]
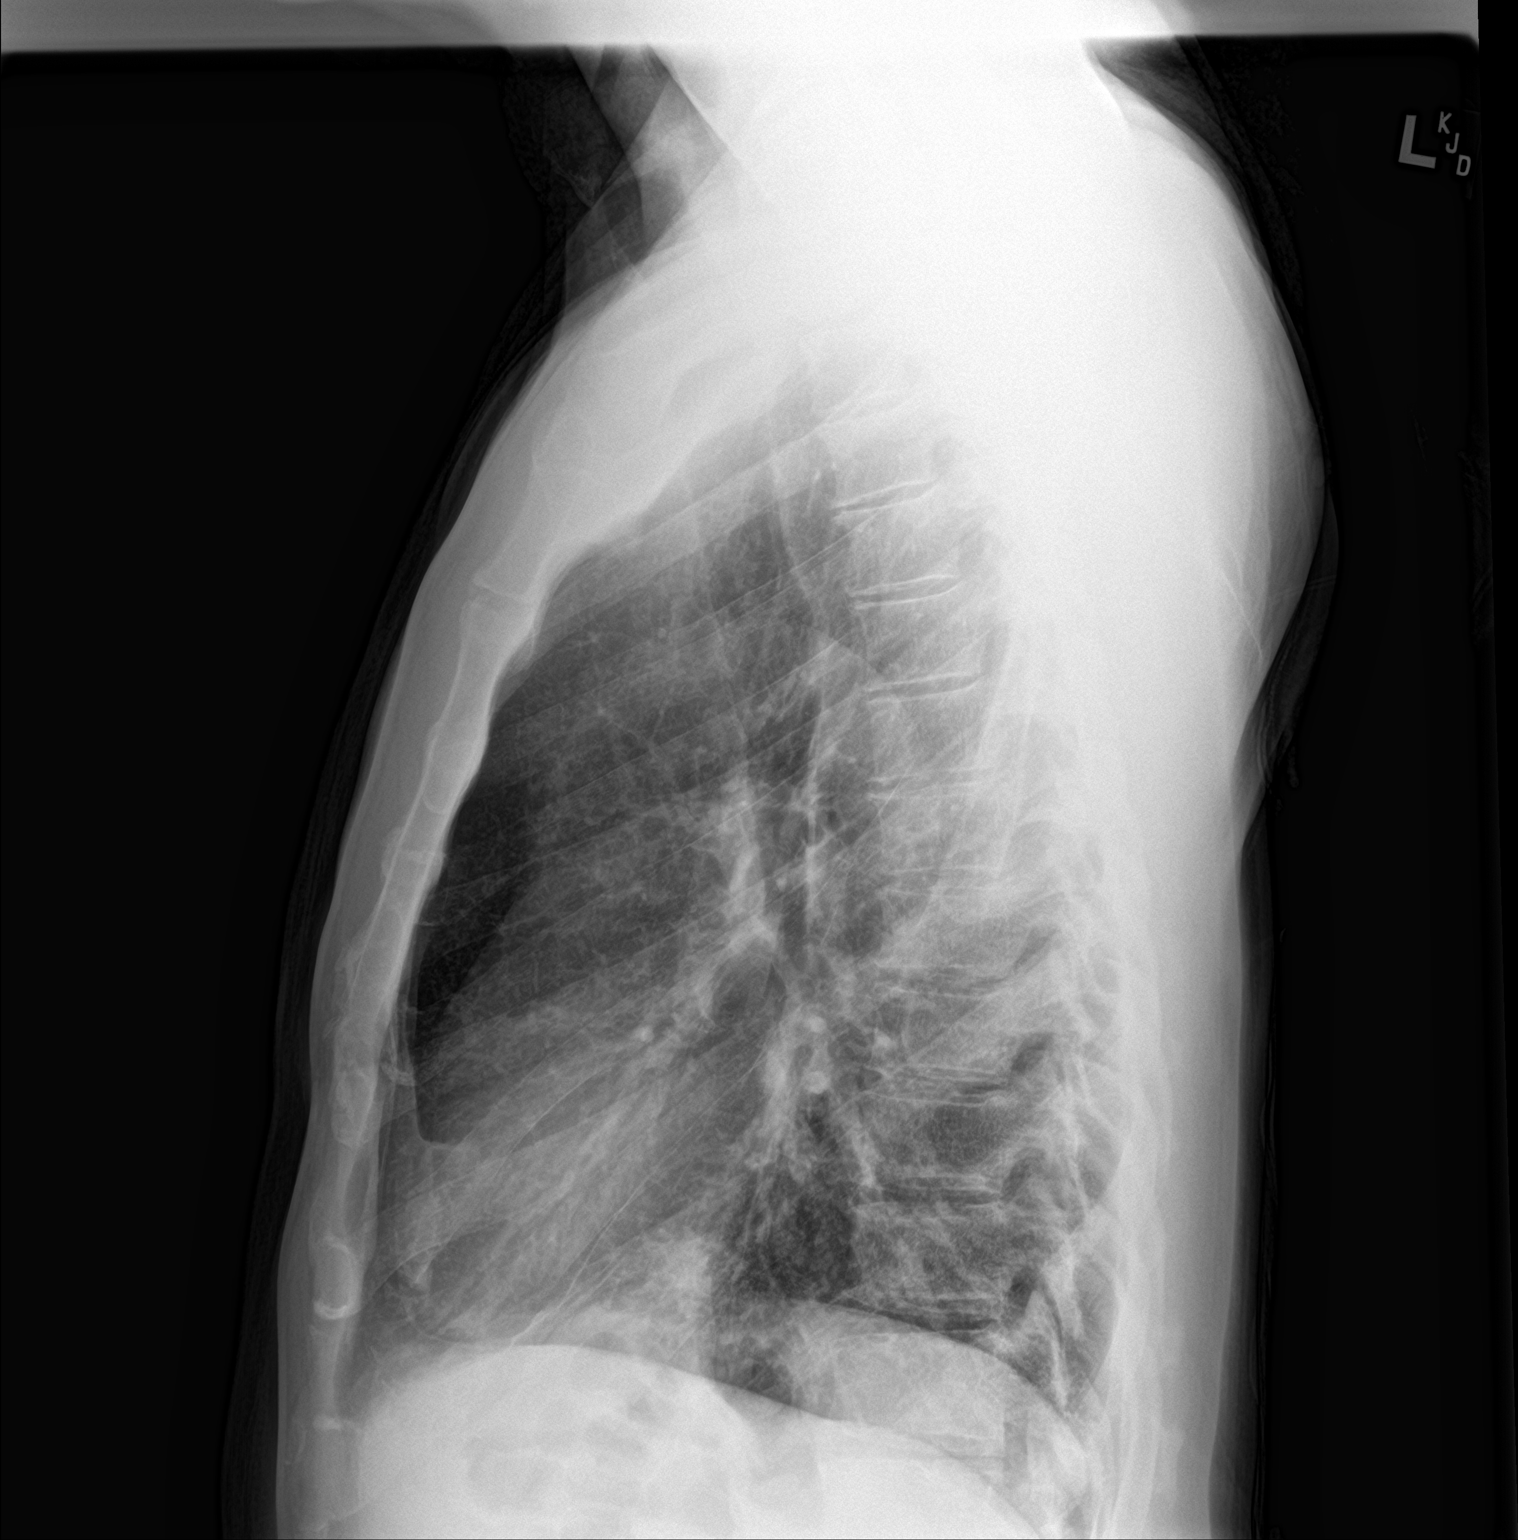

[2 of 2 positions shown; findings below may reference images not displayed]

FINDINGS: Left lower lobe pneumonia. No pleural effusion. Normal cardiac size.
No pneumothorax
IMPRESSION: Left lower lobe pneumonia. Radiographic follow-up to resolution is
recommended

## 2022-09-10 DIAGNOSIS — F1920 Other psychoactive substance dependence, uncomplicated: Secondary | ICD-10-CM | POA: Diagnosis not present

## 2022-09-16 DIAGNOSIS — F1920 Other psychoactive substance dependence, uncomplicated: Secondary | ICD-10-CM | POA: Diagnosis not present

## 2022-10-05 DIAGNOSIS — F1920 Other psychoactive substance dependence, uncomplicated: Secondary | ICD-10-CM | POA: Diagnosis not present

## 2022-10-12 DIAGNOSIS — F1920 Other psychoactive substance dependence, uncomplicated: Secondary | ICD-10-CM | POA: Diagnosis not present

## 2022-10-17 DIAGNOSIS — F1920 Other psychoactive substance dependence, uncomplicated: Secondary | ICD-10-CM | POA: Diagnosis not present

## 2022-10-22 DIAGNOSIS — D235 Other benign neoplasm of skin of trunk: Secondary | ICD-10-CM | POA: Diagnosis not present

## 2022-11-03 DIAGNOSIS — F419 Anxiety disorder, unspecified: Secondary | ICD-10-CM | POA: Diagnosis not present

## 2022-11-10 DIAGNOSIS — F1920 Other psychoactive substance dependence, uncomplicated: Secondary | ICD-10-CM | POA: Diagnosis not present

## 2022-11-18 DIAGNOSIS — F1920 Other psychoactive substance dependence, uncomplicated: Secondary | ICD-10-CM | POA: Diagnosis not present

## 2022-11-22 DIAGNOSIS — F1920 Other psychoactive substance dependence, uncomplicated: Secondary | ICD-10-CM | POA: Diagnosis not present

## 2022-11-29 DIAGNOSIS — F1920 Other psychoactive substance dependence, uncomplicated: Secondary | ICD-10-CM | POA: Diagnosis not present

## 2022-12-09 DIAGNOSIS — F1920 Other psychoactive substance dependence, uncomplicated: Secondary | ICD-10-CM | POA: Diagnosis not present

## 2022-12-16 DIAGNOSIS — F1920 Other psychoactive substance dependence, uncomplicated: Secondary | ICD-10-CM | POA: Diagnosis not present

## 2022-12-20 DIAGNOSIS — F1920 Other psychoactive substance dependence, uncomplicated: Secondary | ICD-10-CM | POA: Diagnosis not present

## 2023-01-19 DIAGNOSIS — H524 Presbyopia: Secondary | ICD-10-CM | POA: Diagnosis not present

## 2023-08-09 ENCOUNTER — Other Ambulatory Visit (HOSPITAL_COMMUNITY): Payer: Self-pay

## 2023-08-09 ENCOUNTER — Encounter: Payer: Self-pay | Admitting: Physician Assistant

## 2023-08-09 ENCOUNTER — Ambulatory Visit: Payer: Self-pay | Admitting: Physician Assistant

## 2023-08-09 ENCOUNTER — Ambulatory Visit: Payer: Self-pay | Admitting: Primary Care

## 2023-08-09 VITALS — BP 112/68 | HR 90 | Ht 74.0 in | Wt 185.4 lb

## 2023-08-09 DIAGNOSIS — L309 Dermatitis, unspecified: Secondary | ICD-10-CM

## 2023-08-09 DIAGNOSIS — F17210 Nicotine dependence, cigarettes, uncomplicated: Secondary | ICD-10-CM | POA: Diagnosis not present

## 2023-08-09 MED ORDER — METHYLPREDNISOLONE ACETATE 80 MG/ML IJ SUSP
80.0000 mg | Freq: Once | INTRAMUSCULAR | Status: AC
  Administered 2023-08-10: 80 mg via INTRAMUSCULAR

## 2023-08-09 MED ORDER — TRIAMCINOLONE ACETONIDE 0.1 % EX CREA
1.0000 | TOPICAL_CREAM | Freq: Two times a day (BID) | CUTANEOUS | 0 refills | Status: DC
  Filled 2023-08-09: qty 80, 3d supply, fill #0
  Filled 2023-08-12: qty 80, 40d supply, fill #0

## 2023-08-09 NOTE — Progress Notes (Unsigned)
 Established Patient Office Visit  Subjective   Patient ID: Cameron Howell, male    DOB: 1966/05/04  Age: 58 y.o. MRN: [id number]  Chief Complaint  Patient presents with   Rash  Discussed the use of AI scribe software for clinical note transcription with the patient, who gave verbal consent to proceed.  History of Present Illness         The patient presents with a generalized rash of a couple weeks duration. The rash is described as 'popping up' in different areas, with no specific location. The skin turns red and is particularly severe in the arm and leg areas. The patient denies any new medications, detergents, or body washes. He initially suspected a detergent allergy, as he has a known allergy to Gain, but switching to a hypoallergenic detergent did not resolve the issue. The rash intensifies throughout the day, particularly after removing work clothes, and is described as feeling 'on fire.' The patient has pets in the home, but there has been no change in this regard. No one else in the household has a similar rash. The patient has been using hydrocortisone cream and taking Benadryl at night to manage the itching, with limited success. The rash has a web-like appearance and is raised and swollen in some areas. The patient denies any significant stress.    Past Medical History:  Diagnosis Date   Achilles tendon rupture 05/29/2013   Social History   Socioeconomic History   Marital status: Married    Spouse name: Not on file   Number of children: Not on file   Years of education: Not on file   Highest education level: Not on file  Occupational History   Not on file  Tobacco Use   Smoking status: Every Day    Current packs/day: 1.00    Average packs/day: 1 pack/day for 20.0 years (20.0 ttl pk-yrs)    Types: Cigarettes   Smokeless tobacco: Never   Tobacco comments:    20 years smoking  Vaping Use   Vaping status: Never Used  Substance and Sexual Activity   Alcohol use: Yes     Alcohol/week: 0.0 standard drinks of alcohol    Comment: socially   Drug use: No   Sexual activity: Yes    Partners: Female  Other Topics Concern   Not on file  Social History Narrative   Not on file   Social Drivers of Health   Financial Resource Strain: Not on file  Food Insecurity: Not on file  Transportation Needs: Not on file  Physical Activity: Not on file  Stress: Not on file  Social Connections: Not on file  Intimate Partner Violence: Not on file   Family History  Problem Relation Age of Onset   Cancer Mother    Diabetes Mother    Hypertension Mother    Cancer Father    Hypertension Father    Colon cancer Father    No Known Allergies  Review of Systems  Constitutional: Negative.   HENT: Negative.    Eyes: Negative.   Respiratory:  Negative for shortness of breath.   Cardiovascular:  Negative for chest pain.  Gastrointestinal: Negative.   Genitourinary: Negative.   Musculoskeletal: Negative.   Skin:  Positive for itching and rash.  Neurological: Negative.   Endo/Heme/Allergies: Negative.   Psychiatric/Behavioral: Negative.        Objective:     BP 112/68 (BP Location: Left Arm, Patient Position: Sitting, Cuff Size: Large)   Pulse 90  Ht 6\' 2"  (1.88 m)   Wt 185 lb 6.4 oz (84.1 kg)   BMI 23.80 kg/m  BP Readings from Last 3 Encounters:  08/09/23 112/68  11/25/21 128/87  09/22/21 (!) 164/83   Wt Readings from Last 3 Encounters:  08/09/23 185 lb 6.4 oz (84.1 kg)  11/25/21 176 lb (79.8 kg)  09/22/21 178 lb 3.2 oz (80.8 kg)    Physical Exam Vitals and nursing note reviewed.  Constitutional:      Appearance: Normal appearance.  HENT:     Head: Normocephalic and atraumatic.     Right Ear: External ear normal.     Left Ear: External ear normal.     Nose: Nose normal.     Mouth/Throat:     Mouth: Mucous membranes are moist.     Pharynx: Oropharynx is clear.  Eyes:     Extraocular Movements: Extraocular movements intact.      Conjunctiva/sclera: Conjunctivae normal.     Pupils: Pupils are equal, round, and reactive to light.  Cardiovascular:     Rate and Rhythm: Normal rate and regular rhythm.     Pulses: Normal pulses.     Heart sounds: Normal heart sounds.  Pulmonary:     Effort: Pulmonary effort is normal.     Breath sounds: Normal breath sounds.  Musculoskeletal:        General: Normal range of motion.     Cervical back: Normal range of motion and neck supple.  Skin:    General: Skin is warm and dry.     Comments: Erythematous rash on arm and shoulder and thighs, raised and swollen.  See photos   Neurological:     General: No focal deficit present.     Mental Status: He is alert and oriented to person, place, and time.  Psychiatric:        Mood and Affect: Mood normal.        Behavior: Behavior normal.        Thought Content: Thought content normal.        Judgment: Judgment normal.             Assessment & Plan:   Problem List Items Addressed This Visit   None Visit Diagnoses       Dermatitis    -  Primary   Relevant Medications   methylPREDNISolone acetate (DEPO-MEDROL) injection 80 mg   triamcinolone cream (KENALOG) 0.1 %      1. Dermatitis (Primary) Diffuse, pruritic rash of unknown etiology, unresponsive to hydrocortisone and antihistamines. No new detergents, body washes, or medications. No similar rash in household contacts. -Administer steroid injection today -Prescribe topical steroid cream for localized treatment. -Continue Benadryl as needed for pruritus. -Consider referral to Allergy for testing if rash recurs.  - methylPREDNISolone acetate (DEPO-MEDROL) injection 80 mg - triamcinolone cream (KENALOG) 0.1 %; Apply 1 Application topically 2 (two) times daily.  Dispense: 80 g; Refill: 0   I have reviewed the patient's medical history (PMH, PSH, Social History, Family History, Medications, and allergies) , and have been updated if relevant. I spent 30 minutes  reviewing chart and  face to face time with patient.   Return if symptoms worsen or fail to improve.    Kasandra Knudsen Mayers, PA-C

## 2023-08-09 NOTE — Patient Instructions (Addendum)
 VISIT SUMMARY:  Today, you were seen for a generalized rash that has been present for a couple of weeks. The rash is red, severe in some areas, and feels like it is 'on fire,' especially after removing your work clothes. You have tried switching detergents and using hydrocortisone cream and Benadryl, but these have not provided much relief.  YOUR PLAN:  -DERMATITIS: Dermatitis is a general term for inflammation of the skin, which can cause redness, swelling, and itching. Today, you received a steroid injection to help relieve your symptoms. You were also prescribed a topical steroid cream to apply to the affected areas. Continue taking Benadryl as needed for itching. If the rash comes back, we may refer you to an allergy specialist for further testing.  INSTRUCTIONS:  Please follow up if the rash does not improve or if it recurs. Continue using the prescribed treatments and monitor your symptoms.  Cameron Jaffe, PA-C Physician Assistant [facility] [email or web address]  Rash, Adult A rash is a breakout of spots or blotches on the skin. It can affect the way the skin looks and feels. Many things can cause a rash. Common causes include: Viral infections. These include colds, measles, and hand, foot, and mouth disease. Bacterial infections. These include scarlet fever and impetigo. Fungal infections. These include athlete's foot, ringworm, and yeast rashes. Skin irritation. This may be from heat rash, exposure to moisture or friction for a long time (intertrigo), or exposure to soap or skin care products (eczema). Allergic reactions. These may be caused by foods, medicines, or things like poison ivy. Some rashes may go away after a few days. Others may last for a few weeks. The goal of treatment is to stop the itching and keep the rash from spreading. Follow these instructions at home: Medicine Take or apply over-the-counter and  prescription medicines only as told by your health care provider. These may include: Corticosteroids. These can help treat red or swollen skin. They may be given as creams or as medicines to take by mouth (orally). Anti-itch lotions. Allergy medicines. Pain medicine. Antifungal medicine if the rash is from a fungal infection. Antibiotics if you have an infection.  Skin care Apply cool, wet cloths (compresses) to the affected areas. Do not scratch or rub your skin. Avoid covering the rash. Keep it exposed to air as often as you can. Managing itching and discomfort Avoid hot showers and baths. These can make itching worse. A cold shower may help. Try taking a bath with: Epsom salts. You can get these at your local pharmacy or grocery store. Follow the instructions on the package. Baking soda. Pour a small amount into the bath as told by your provider. Colloidal oatmeal. You can get this at your local pharmacy or grocery store. Follow the instructions on the package. Try putting baking soda paste on your skin. Stir water into baking soda until it becomes like a paste. Try using calamine lotion or cortisone cream to help with itchiness. Keep cool. Stay out of the sun. Sweating and being hot can make itching worse. General instructions  Rest as needed. Drink enough fluid to keep your pee (urine) pale yellow. Wear loose-fitting clothes. Avoid scented soaps, detergents, and perfumes. Use gentle soaps, detergents, perfumes, and cosmetics. Avoid the things that cause your rash (triggers). Keep a journal to help keep track of your triggers. Write down: What you eat. What cosmetics you use. What you drink. What you wear. This includes jewelry. Contact a health care provider if:  You sweat at night more than normal. You pee (urinate) more or less than normal, or your pee is a darker color than normal. Your eyes become sensitive to light. Your skin or the white parts of your eyes turn yellow  (jaundice). Your skin tingles or is numb. You get painful blisters in your nose or mouth. Your rash does not go away after a few days, or it gets worse. You are more tired or thirsty than normal. You have new or worse symptoms. These may include: Pain in your abdomen. Fever. Diarrhea or vomiting. Weakness or weight loss. Get help right away if: You get confused. You have a severe headache, a stiff neck, or severe joint pain or stiffness. You become very sleepy or not responsive. You have a seizure. This information is not intended to replace advice given to you by your health care provider. Make sure you discuss any questions you have with your health care provider. Document Revised: 03/19/2022 Document Reviewed: 03/19/2022 Elsevier Patient Education  2024 [employer].

## 2023-08-09 NOTE — Telephone Encounter (Signed)
 Copied from CRM [id number]. Topic: Clinical - Red Word Triage >> Aug 09, 2023  3:12 PM Elle L wrote: Red Word that prompted transfer to Nurse Triage: The patient is a patient at [facility]. However, he was calling to establish care at [facility]. He is breaking out in hives and it is itching and red.  Chief Complaint: hives "macular red rash" per wife Symptoms: itching, redness, scattered from both legs, to forearm, head and trunk and buttocks Frequency: 2 weeks Pertinent Negatives: Patient denies blisters fever Disposition: [] ED /[] Urgent Care (no appt availability in office) / [] Appointment(In office/virtual)/ []  [facility]/ [] Home Care/ [] Refused Recommended Disposition /[x] [facility]/ []  Follow-up with PCP Additional Notes: pt given appt as a NP at [facility]- pt did not want to go to [facility]  Reason for Disposition  SEVERE itching (i.e., interferes with sleep, normal activities or school)  Answer Assessment - Initial Assessment Questions 1. APPEARANCE of RASH: "Describe the rash." (e.g., spots, blisters, raised areas, skin peeling, scaly)     Red looks like a macular rash comes and goes  2. SIZE: "How big are the spots?" (e.g., tip of pen, eraser, coin; inches, centimeters)     All the down  3. LOCATION: "Where is the rash located?"     Back of leg and thigh flares in different places, arms, trunk shoulders   4. COLOR: "What color is the rash?" (Note: It is difficult to assess rash color in people with darker-colored skin. When this situation occurs, simply ask the caller to describe what they see.)     Red  5. ONSET: "When did the rash begin?"     2 weeks  6. FEVER: "Do you have a fever?" If Yes, ask: "What is your temperature, how was it measured, and when did it start?"     no 7. ITCHING: "Does the rash itch?" If Yes, ask: "How bad is the itch?" (Scale 1-10; or mild, moderate, severe)     Yes  8/10 8. CAUSE: "What do you think is  causing the rash?"     *No Answer* 9. MEDICINE FACTORS: "Have you started any new medicines within the last 2 weeks?" (e.g., antibiotics)      no 10. OTHER SYMPTOMS: "Do you have any other symptoms?" (e.g., dizziness, headache, sore throat, joint pain)       no  Protocols used: Rash or Redness - [facility]

## 2023-08-10 ENCOUNTER — Other Ambulatory Visit (HOSPITAL_COMMUNITY): Payer: Self-pay

## 2023-08-10 ENCOUNTER — Encounter: Payer: Self-pay | Admitting: Physician Assistant

## 2023-08-10 ENCOUNTER — Other Ambulatory Visit: Payer: Self-pay

## 2023-08-10 DIAGNOSIS — L309 Dermatitis, unspecified: Secondary | ICD-10-CM | POA: Diagnosis not present

## 2023-08-10 DIAGNOSIS — F17210 Nicotine dependence, cigarettes, uncomplicated: Secondary | ICD-10-CM | POA: Diagnosis not present

## 2023-08-10 NOTE — Telephone Encounter (Signed)
 Noted.

## 2023-08-12 ENCOUNTER — Other Ambulatory Visit (HOSPITAL_COMMUNITY): Payer: Self-pay

## 2023-08-12 ENCOUNTER — Other Ambulatory Visit: Payer: Self-pay

## 2023-08-15 ENCOUNTER — Other Ambulatory Visit (INDEPENDENT_AMBULATORY_CARE_PROVIDER_SITE_OTHER): Payer: Self-pay | Admitting: Primary Care

## 2023-08-15 DIAGNOSIS — I10 Essential (primary) hypertension: Secondary | ICD-10-CM

## 2023-08-24 ENCOUNTER — Other Ambulatory Visit (HOSPITAL_COMMUNITY): Payer: Self-pay

## 2023-10-11 ENCOUNTER — Encounter: Payer: Self-pay | Admitting: Family Medicine

## 2023-10-11 ENCOUNTER — Ambulatory Visit: Payer: Commercial Managed Care - PPO | Admitting: Family Medicine

## 2023-10-11 VITALS — BP 110/72 | HR 76 | Temp 98.3°F | Ht 74.0 in | Wt 180.0 lb

## 2023-10-11 DIAGNOSIS — Z1211 Encounter for screening for malignant neoplasm of colon: Secondary | ICD-10-CM | POA: Diagnosis not present

## 2023-10-11 DIAGNOSIS — Z23 Encounter for immunization: Secondary | ICD-10-CM

## 2023-10-11 DIAGNOSIS — F17210 Nicotine dependence, cigarettes, uncomplicated: Secondary | ICD-10-CM | POA: Diagnosis not present

## 2023-10-11 DIAGNOSIS — Z125 Encounter for screening for malignant neoplasm of prostate: Secondary | ICD-10-CM | POA: Diagnosis not present

## 2023-10-11 DIAGNOSIS — Z0000 Encounter for general adult medical examination without abnormal findings: Secondary | ICD-10-CM | POA: Insufficient documentation

## 2023-10-11 DIAGNOSIS — Z0001 Encounter for general adult medical examination with abnormal findings: Secondary | ICD-10-CM | POA: Diagnosis not present

## 2023-10-11 NOTE — Patient Instructions (Signed)
 It was great to meet you today and I'm excited to have you join the [facility] practice. I hope you had a positive experience today! If you feel so inclined, please feel free to recommend our practice to friends and family. Kurtis Bushman, FNP-C

## 2023-10-11 NOTE — Addendum Note (Signed)
 Addended by: Anner Bars on: 10/11/2023 04:15 PM   Modules accepted: Orders

## 2023-10-11 NOTE — Progress Notes (Signed)
 New Patient Office Visit  Subjective    Patient ID: Cameron Howell, male    DOB: 1966/05/20  Age: 58 y.o. MRN: [id number]  CC:  Chief Complaint  Patient presents with   Establish Care   Hypertension    States he was taking amlodipine      HPI Cameron Howell presents to establish care. Oriented to practice routines and expectations. Has seen PCP in last year. PMH includes HTN. Concerns include none. 3 pack year smoker, not ready to quit. Physically strenuous job with [employer]. Eats dinner at home, quick meals during day.  Colon CA screening: cologuard 01/26/2021 normal. Due this August. Prostate CA screening: Agrees to PSA testing Tobacco: smoker  (1 ppd x 30 yrs) Vaccines: Tdap last year, PNA and shingles today     10/11/2023    2:52 PM 08/09/2023    5:13 PM 11/25/2021    3:21 PM 09/22/2021    3:52 PM  GAD 7 : Generalized Anxiety Score  Nervous, Anxious, on Edge 0 0 0 0  Control/stop worrying 0 0 0 0  Worry too much - different things 0 0 0 0  Trouble relaxing 0 0 0 0  Restless 0 0 0 0  Easily annoyed or irritable 0 0 0 0  Afraid - awful might happen 0 0 0 0  Total GAD 7 Score 0 0 0 0  Anxiety Difficulty   Not difficult at all Not difficult at all       10/11/2023    3:36 PM 10/11/2023    2:52 PM 08/09/2023    5:12 PM 11/25/2021    3:21 PM 09/22/2021    3:52 PM  Depression screen PHQ 2/9  Decreased Interest 0 0 0 0 0  Down, Depressed, Hopeless 0 0 0 0 0  PHQ - 2 Score 0 0 0 0 0  Altered sleeping 0 0 0    Tired, decreased energy 0 0 0    Change in appetite 0 0 0    Feeling bad or failure about yourself  0 0 0    Trouble concentrating 0 0 0    Moving slowly or fidgety/restless 0 0 0    Suicidal thoughts 0 0 0    PHQ-9 Score 0 0 0    Difficult doing work/chores Not difficult at all         Outpatient Encounter Medications as of 10/11/2023  Medication Sig   [DISCONTINUED] amLODipine  (NORVASC ) 10 MG tablet Take 1 tablet (10 mg total) by mouth daily. (Patient not taking: Reported on  08/09/2023)   [DISCONTINUED] triamcinolone  cream (KENALOG ) 0.1 % Apply 1 Application topically 2 (two) times daily.   No facility-administered encounter medications on file as of 10/11/2023.    Past Medical History:  Diagnosis Date   Achilles tendon rupture 05/29/2013    Past Surgical History:  Procedure Laterality Date   EYE MUSCLE SURGERY      Family History  Problem Relation Age of Onset   Cancer Mother    Diabetes Mother    Hypertension Mother    Cancer Father    Hypertension Father    Colon cancer Father     Social History   Socioeconomic History   Marital status: Married    Spouse name: Not on file   Number of children: Not on file   Years of education: Not on file   Highest education level: 12th grade  Occupational History   Not on file  Tobacco Use   Smoking status:  Every Day    Current packs/day: 1.00    Average packs/day: 1 pack/day for 20.0 years (20.0 ttl pk-yrs)    Types: Cigarettes   Smokeless tobacco: Never   Tobacco comments:    20 years smoking  Vaping Use   Vaping status: Never Used  Substance and Sexual Activity   Alcohol use: Yes    Alcohol/week: 0.0 standard drinks of alcohol    Comment: socially   Drug use: No   Sexual activity: Yes    Partners: Female  Other Topics Concern   Not on file  Social History Narrative   Not on file   Social Drivers of Health   Financial Resource Strain: Low Risk  (10/10/2023)   Overall Financial Resource Strain (CARDIA)    Difficulty of Paying Living Expenses: Not very hard  Food Insecurity: No Food Insecurity (10/10/2023)   Hunger Vital Sign    Worried About Running Out of Food in the Last Year: Never true    Ran Out of Food in the Last Year: Never true  Transportation Needs: No Transportation Needs (10/10/2023)   PRAPARE - Administrator, Civil Service (Medical): No    Lack of Transportation (Non-Medical): No  Physical Activity: Sufficiently Active (10/10/2023)   Exercise Vital Sign     Days of Exercise per Week: 5 days    Minutes of Exercise per Session: 60 min  Stress: No Stress Concern Present (10/10/2023)   [employer] of Occupational Health - Occupational Stress Questionnaire    Feeling of Stress : Not at all  Social Connections: Socially Integrated (10/10/2023)   Social Connection and Isolation Panel [NHANES]    Frequency of Communication with Friends and Family: Twice a week    Frequency of Social Gatherings with Friends and Family: Once a week    Attends Religious Services: More than 4 times per year    Active Member of [employer] or Organizations: Yes    Attends Engineer, structural: More than 4 times per year    Marital Status: Married  Catering manager Violence: Not on file    Review of Systems  Constitutional: Negative.   HENT: Negative.    Eyes: Negative.   Respiratory: Negative.    Cardiovascular: Negative.   Gastrointestinal: Negative.   Genitourinary: Negative.   Musculoskeletal: Negative.   Skin: Negative.   Neurological: Negative.   Endo/Heme/Allergies: Negative.   Psychiatric/Behavioral: Negative.    All other systems reviewed and are negative.       Objective    BP 110/72   Pulse 76   Temp 98.3 F (36.8 C) (Oral)   Ht 6\' 2"  (1.88 m)   Wt 180 lb (81.6 kg)   SpO2 99%   BMI 23.11 kg/m   Physical Exam Vitals and nursing note reviewed.  Constitutional:      Appearance: Normal appearance. He is normal weight.  HENT:     Head: Normocephalic and atraumatic.     Right Ear: Tympanic membrane, ear canal and external ear normal.     Left Ear: Tympanic membrane, ear canal and external ear normal.     Nose: Nose normal.     Mouth/Throat:     Mouth: Mucous membranes are moist.     Pharynx: Oropharynx is clear.     Comments: edentulous Eyes:     Extraocular Movements: Extraocular movements intact.     Right eye: Normal extraocular motion and no nystagmus.     Left eye: Normal extraocular motion and no nystagmus.  Conjunctiva/sclera: Conjunctivae normal.     Pupils: Pupils are equal, round, and reactive to light.  Cardiovascular:     Rate and Rhythm: Normal rate and regular rhythm.     Pulses: Normal pulses.     Heart sounds: Normal heart sounds.  Pulmonary:     Effort: Pulmonary effort is normal.     Breath sounds: Normal breath sounds.  Abdominal:     General: Bowel sounds are normal.     Palpations: Abdomen is soft.  Genitourinary:    Comments: Deferred using shared decision making Musculoskeletal:        General: Normal range of motion.     Cervical back: Normal range of motion and neck supple.  Skin:    General: Skin is warm and dry.     Capillary Refill: Capillary refill takes less than 2 seconds.  Neurological:     General: No focal deficit present.     Mental Status: He is alert. Mental status is at baseline.  Psychiatric:        Mood and Affect: Mood normal.        Speech: Speech normal.        Behavior: Behavior normal.        Thought Content: Thought content normal.        Cognition and Memory: Cognition and memory normal.        Judgment: Judgment normal.         Assessment & Plan:   Problem List Items Addressed This Visit     Physical exam, annual - Primary   Today your medical history was reviewed and routine physical exam with labs was performed. Recommend 150 minutes of moderate intensity exercise weekly and consuming a well-balanced diet. Advised to stop smoking if a smoker, avoid smoking if a non-smoker, limit alcohol consumption to 1 drink per day for women and 2 drinks per day for men, and avoid illicit drug use. Counseled in mental health awareness and when to seek medical care. Vaccine maintenance discussed. Appropriate health maintenance items reviewed. Return to office in 1 year for annual physical exam.       Relevant Orders   Cologuard   Ambulatory Referral Lung Cancer Screening [facility]   CBC with Differential/Platelet   Comprehensive  metabolic panel with GFR   Lipid panel   PSA   Cigarette nicotine dependence without complication   3-5 minute discussion regarding the harms of tobacco use, the benefits of cessation, and methods of cessation. Discussed that there are medication options to help with cessation. Provided printed education on steps to quit smoking. Patient is not ready to try a medication to help. Has tried Chiantix in past       Relevant Orders   Ambulatory Referral Lung Cancer Screening [facility]   Other Visit Diagnoses       Colon cancer screening       Relevant Orders   Cologuard     Prostate cancer screening       Relevant Orders   PSA     Immunization due       Relevant Orders   Zoster Recombinant (Shingrix )   Pneumococcal conjugate vaccine 20-valent (Completed)       Return in about 1 year (around 10/10/2024) for annual physical with labs 1 week prior.   Jenelle Mis, FNP

## 2023-10-11 NOTE — Assessment & Plan Note (Signed)
 3-5 minute discussion regarding the harms of tobacco use, the benefits of cessation, and methods of cessation. Discussed that there are medication options to help with cessation. Provided printed education on steps to quit smoking. Patient is not ready to try a medication to help. Has tried Chiantix in past

## 2023-10-11 NOTE — Assessment & Plan Note (Signed)

## 2023-10-12 ENCOUNTER — Other Ambulatory Visit: Payer: Self-pay | Admitting: Family Medicine

## 2023-10-12 LAB — COMPREHENSIVE METABOLIC PANEL WITH GFR
AG Ratio: 2.4 (calc) (ref 1.0–2.5)
ALT: 12 U/L (ref 9–46)
AST: 13 U/L (ref 10–35)
Albumin: 4.5 g/dL (ref 3.6–5.1)
Alkaline phosphatase (APISO): 48 U/L (ref 35–144)
BUN: 12 mg/dL (ref 7–25)
CO2: 30 mmol/L (ref 20–32)
Calcium: 9.4 mg/dL (ref 8.6–10.3)
Chloride: 103 mmol/L (ref 98–110)
Creat: 0.9 mg/dL (ref 0.70–1.30)
Globulin: 1.9 g/dL (ref 1.9–3.7)
Glucose, Bld: 79 mg/dL (ref 65–99)
Potassium: 4.2 mmol/L (ref 3.5–5.3)
Sodium: 139 mmol/L (ref 135–146)
Total Bilirubin: 0.7 mg/dL (ref 0.2–1.2)
Total Protein: 6.4 g/dL (ref 6.1–8.1)
eGFR: 99 mL/min/{1.73_m2} (ref >=60)

## 2023-10-12 LAB — CBC WITH DIFFERENTIAL/PLATELET
Absolute Lymphocytes: 1804 {cells}/uL (ref 850–3900)
Absolute Monocytes: 326 {cells}/uL (ref 200–950)
Basophils Absolute: 31 {cells}/uL (ref 0–200)
Basophils Relative: 0.7 %
Eosinophils Absolute: 220 {cells}/uL (ref 15–500)
Eosinophils Relative: 5 %
HCT: 42 % (ref 38.5–50.0)
Hemoglobin: 14.1 g/dL (ref 13.2–17.1)
MCH: 31.5 pg (ref 27.0–33.0)
MCHC: 33.6 g/dL (ref 32.0–36.0)
MCV: 94 fL (ref 80.0–100.0)
MPV: 10.5 fL (ref 7.5–12.5)
Monocytes Relative: 7.4 %
Neutro Abs: 2020 {cells}/uL (ref 1500–7800)
Neutrophils Relative %: 45.9 %
Platelets: 215 10*3/uL (ref 140–400)
RBC: 4.47 10*6/uL (ref 4.20–5.80)
RDW: 13 % (ref 11.0–15.0)
Total Lymphocyte: 41 %
WBC: 4.4 10*3/uL (ref 3.8–10.8)

## 2023-10-12 LAB — LIPID PANEL
Cholesterol: 164 mg/dL (ref <200)
HDL: 31 mg/dL — ABNORMAL LOW (ref >=40)
LDL Cholesterol (Calc): 99 mg/dL
Non-HDL Cholesterol (Calc): 133 mg/dL — ABNORMAL HIGH (ref <130)
Total CHOL/HDL Ratio: 5.3 (calc) — ABNORMAL HIGH (ref <5.0)
Triglycerides: 224 mg/dL — ABNORMAL HIGH (ref <150)

## 2023-10-12 LAB — PSA: PSA: 0.55 ng/mL (ref <=4.00)

## 2023-10-12 MED ORDER — ROSUVASTATIN CALCIUM 10 MG PO TABS
10.0000 mg | ORAL_TABLET | Freq: Every day | ORAL | 3 refills | Status: AC
  Filled 2023-10-12: qty 90, 90d supply, fill #0

## 2023-10-13 ENCOUNTER — Other Ambulatory Visit (HOSPITAL_COMMUNITY): Payer: Self-pay

## 2023-10-31 LAB — COLOGUARD

## 2023-11-08 ENCOUNTER — Encounter: Payer: Self-pay | Admitting: Emergency Medicine

## 2023-11-08 DIAGNOSIS — Z1211 Encounter for screening for malignant neoplasm of colon: Secondary | ICD-10-CM | POA: Diagnosis not present

## 2023-11-11 ENCOUNTER — Other Ambulatory Visit (HOSPITAL_COMMUNITY): Payer: Self-pay

## 2023-11-15 LAB — COLOGUARD: COLOGUARD: NEGATIVE

## 2023-11-28 ENCOUNTER — Ambulatory Visit: Admitting: Family Medicine

## 2023-11-28 ENCOUNTER — Other Ambulatory Visit

## 2023-12-19 ENCOUNTER — Ambulatory Visit: Admitting: Family Medicine

## 2024-04-26 ENCOUNTER — Encounter: Payer: Self-pay | Admitting: Family Medicine

## 2024-05-12 ENCOUNTER — Encounter: Payer: Self-pay | Admitting: Family Medicine

## 2024-05-15 ENCOUNTER — Ambulatory Visit: Admitting: Family Medicine

## 2024-05-31 ENCOUNTER — Telehealth: Admitting: Family Medicine

## 2024-05-31 DIAGNOSIS — M799 Soft tissue disorder, unspecified: Secondary | ICD-10-CM

## 2024-05-31 NOTE — Progress Notes (Signed)
°  Because of severity of symptoms and skin breakdown/larger sore, I feel your condition warrants further evaluation and I recommend that you be seen in a face-to-face visit.   NOTE: There will be NO CHARGE for this E-Visit   If you are having a true medical emergency, please call 911.     For an urgent face to face visit, [facility] has multiple urgent care centers for your convenience.  Click the link below for the full list of locations and hours, walk-in wait times, appointment scheduling options and driving directions:  Urgent Care - [place], [place], [place], [place], [place], [place]  [place]     Your MyChart E-visit questionnaire answers were reviewed by a board certified advanced clinical practitioner to complete your personal care plan based on your specific symptoms.    Thank you for using e-Visits.

## 2024-06-08 ENCOUNTER — Ambulatory Visit
Admission: EM | Admit: 2024-06-08 | Discharge: 2024-06-08 | Disposition: A | Discharge status: Home / Self Care | Attending: Emergency Medicine | Admitting: Emergency Medicine

## 2024-06-08 DIAGNOSIS — R21 Rash and other nonspecific skin eruption: Secondary | ICD-10-CM | POA: Diagnosis not present

## 2024-06-08 MED ORDER — CLOTRIMAZOLE 1 % EX CREA: TOPICAL_CREAM | CUTANEOUS | 0 refills | Status: AC

## 2024-06-08 NOTE — ED Triage Notes (Signed)
 Pt states he has a rash/ spots on his left foot and pinky toe as well as his right hand x 1 month. Tried triamcinolone  and antifungal cream.

## 2024-06-08 NOTE — Discharge Instructions (Addendum)
 Use the clotrimazole  cream as directed.  Follow-up with a podiatrist or dermatologist such as the ones listed below.

## 2024-06-08 NOTE — ED Provider Notes (Signed)
 " Cameron Howell    CSN: [id number] Arrival date & time: 06/08/24  1606      History   Chief Complaint Chief Complaint  Patient presents with   Rash    HPI Cameron Howell is a 58 y.o. male.  Patient presents with 1 to 70-month history of rash on his left foot and right hand.  He states that this has been successfully resolved in the past using clotrimazole  cream.  He works for [employer] and has long work hours 6 days a week so he is on his feet a lot.  The rash is pruritic but also tender after he is on his feet all day.  No fever or purulent drainage.  He has been treating it with triamcinolone  cream as well as antifungal cream.  Patient had a telehealth visit on 05/31/2024; diagnosed with soft tissue lesion of foot; instructed to be seen in person.  The history is provided by the patient and medical records.    Past Medical History:  Diagnosis Date   Achilles tendon rupture 05/29/2013    Patient Active Problem List   Diagnosis Date Noted   Physical exam, annual 10/11/2023   Cigarette nicotine dependence without complication 10/11/2023   Mixed hyperlipidemia 01/15/2021   Episodic cluster headache, not intractable 08/26/2020   ED (erectile dysfunction) 07/22/2020   Tobacco use disorder 05/29/2013    Past Surgical History:  Procedure Laterality Date   EYE MUSCLE SURGERY         Home Medications    Prior to Admission medications  Medication Sig Start Date End Date Taking? Authorizing Provider  clotrimazole  (LOTRIMIN ) 1 % cream Apply to affected area 2 times daily 06/08/24  Yes Corlis Burnard DEL, NP  rosuvastatin  (CRESTOR ) 10 MG tablet Take 1 tablet (10 mg total) by mouth daily. 10/12/23  Yes Kayla Jeoffrey RAMAN, FNP    Family History Family History  Problem Relation Age of Onset   Cancer Mother    Diabetes Mother    Hypertension Mother    Cancer Father    Hypertension Father    Colon cancer Father     Social History Social History[1]   Allergies   Patient has no  known allergies.   Review of Systems Review of Systems  Constitutional:  Negative for chills and fever.  Musculoskeletal:  Negative for arthralgias, gait problem and joint swelling.  Skin:  Positive for rash. Negative for color change.  Neurological:  Negative for weakness and numbness.     Physical Exam Triage Vital Signs ED Triage Vitals  Encounter Vitals Group     BP 06/08/24 1721 (!) 143/91     Girls Systolic BP Percentile --      Girls Diastolic BP Percentile --      Boys Systolic BP Percentile --      Boys Diastolic BP Percentile --      Pulse Rate 06/08/24 1721 69     Resp 06/08/24 1721 16     Temp 06/08/24 1721 97.6 F (36.4 C)     Temp Source 06/08/24 1721 Oral     SpO2 06/08/24 1721 99 %     Weight --      Height --      Head Circumference --      Peak Flow --      Pain Score 06/08/24 1718 7     Pain Loc --      Pain Education --      Exclude from Growth Chart --  No data found.  Updated Vital Signs BP (!) 143/91 (BP Location: Right Arm)   Pulse 69   Temp 97.6 F (36.4 C) (Oral)   Resp 16   SpO2 99%   Visual Acuity Right Eye Distance:   Left Eye Distance:   Bilateral Distance:    Right Eye Near:   Left Eye Near:    Bilateral Near:     Physical Exam Constitutional:      General: He is not in acute distress. HENT:     Mouth/Throat:     Mouth: Mucous membranes are moist.  Cardiovascular:     Rate and Rhythm: Normal rate.  Pulmonary:     Effort: Pulmonary effort is normal. No respiratory distress.  Musculoskeletal:        General: No swelling. Normal range of motion.  Skin:    General: Skin is warm and dry.     Capillary Refill: Capillary refill takes less than 2 seconds.     Findings: Rash present.     Comments: Dry crusted lesions on left foot and right palm.  No erythema or drainage. See pictures.    Neurological:     General: No focal deficit present.     Mental Status: He is alert.     Sensory: No sensory deficit.     Motor: No  weakness.     Gait: Gait normal.             UC Treatments / Results  Labs (all labs ordered are listed, but only abnormal results are displayed) Labs Reviewed - No data to display  EKG   Radiology No results found.  Procedures Procedures (including critical care time)  Medications Ordered in UC Medications - No data to display  Initial Impression / Assessment and Plan / UC Course  I have reviewed the triage vital signs and the nursing notes.  Pertinent labs & imaging results that were available during my care of the patient were reviewed by me and considered in my medical decision making (see chart for details).    Rash.  Treating today with clotrimazole  cream.  Instructed patient to follow-up with a podiatrist for his feet and a dermatologist for his hand.  Contact information for on-call podiatrist and on-call dermatologist provided.  Education provided on adult rash.  He agrees to plan of care.  Final Clinical Impressions(s) / UC Diagnoses   Final diagnoses:  Rash     Discharge Instructions      Use the clotrimazole  cream as directed.  Follow-up with a podiatrist or dermatologist such as the ones listed below.     ED Prescriptions     Medication Sig Dispense Auth. Provider   clotrimazole  (LOTRIMIN ) 1 % cream Apply to affected area 2 times daily 60 g Corlis Burnard DEL, NP      PDMP not reviewed this encounter.    [1]  Social History Tobacco Use   Smoking status: Every Day    Current packs/day: 1.00    Average packs/day: 1 pack/day for 20.0 years (20.0 ttl pk-yrs)    Types: Cigarettes   Smokeless tobacco: Never   Tobacco comments:    20 years smoking  Vaping Use   Vaping status: Never Used  Substance Use Topics   Alcohol use: Yes    Alcohol/week: 0.0 standard drinks of alcohol    Comment: socially   Drug use: No     Corlis Burnard DEL, NP 06/08/24 1758  "

## 2024-06-11 ENCOUNTER — Other Ambulatory Visit: Payer: Self-pay

## 2024-06-11 ENCOUNTER — Emergency Department (HOSPITAL_COMMUNITY)
Admission: EM | Admit: 2024-06-11 | Discharge: 2024-06-11 | Disposition: A | Discharge status: Home / Self Care | Attending: Emergency Medicine | Admitting: Emergency Medicine

## 2024-06-11 ENCOUNTER — Other Ambulatory Visit (HOSPITAL_COMMUNITY): Payer: Self-pay

## 2024-06-11 ENCOUNTER — Encounter (HOSPITAL_COMMUNITY): Payer: Self-pay | Admitting: *Deleted

## 2024-06-11 ENCOUNTER — Emergency Department (HOSPITAL_COMMUNITY)

## 2024-06-11 DIAGNOSIS — L02416 Cutaneous abscess of left lower limb: Secondary | ICD-10-CM | POA: Diagnosis not present

## 2024-06-11 DIAGNOSIS — L0291 Cutaneous abscess, unspecified: Secondary | ICD-10-CM

## 2024-06-11 DIAGNOSIS — L03116 Cellulitis of left lower limb: Secondary | ICD-10-CM | POA: Diagnosis not present

## 2024-06-11 DIAGNOSIS — M79672 Pain in left foot: Secondary | ICD-10-CM | POA: Diagnosis present

## 2024-06-11 MED ORDER — PENTAFLUOROPROP-TETRAFLUOROETH EX AERO
INHALATION_SPRAY | CUTANEOUS | Status: DC | PRN
  Filled 2024-06-11: qty 30

## 2024-06-11 MED ORDER — LIDOCAINE-PRILOCAINE 2.5-2.5 % EX CREA
1.0000 | TOPICAL_CREAM | CUTANEOUS | 0 refills | Status: AC | PRN
  Filled 2024-06-11 (×2): qty 30, 30d supply, fill #0

## 2024-06-11 MED ORDER — TRIAMCINOLONE ACETONIDE 0.1 % EX CREA
1.0000 | TOPICAL_CREAM | Freq: Two times a day (BID) | CUTANEOUS | 0 refills | Status: AC
  Filled 2024-06-11: qty 30, 15d supply, fill #0
  Filled 2024-06-11: qty 30, 30d supply, fill #0

## 2024-06-11 MED ORDER — SULFAMETHOXAZOLE-TRIMETHOPRIM 800-160 MG PO TABS
1.0000 | ORAL_TABLET | Freq: Two times a day (BID) | ORAL | 0 refills | Status: AC
  Filled 2024-06-11 (×2): qty 20, 10d supply, fill #0

## 2024-06-11 NOTE — ED Provider Notes (Signed)
 " [place] [facility] AT [facility] Provider Note   CSN: [id number] Arrival date & time: 06/11/24  1039     Patient presents with: Foot Pain   Cameron Howell is a 58 y.o. male with no pertinent medical history presenting for evaluation of a painful rash on his left foot.  He describes having intermittent waxing and waning rash to this extremity dating back at least 20 years.  He has never been able to get a diagnosis for this rash but typically it responds to a combination of antifungal and steroid creams.  He was seen at our urgent care on December 26 due to an exacerbation of this rash, now also involving the palm of his right hand.  Please refer to the photos from that visit.  He was placed on clotrimazole  cream and referred to podiatry and dermatology, however with the holiday he has not been able to establish an appointment with the specialties yet.  He returns today secondary to increased pain and purulent drainage from around the medial heel lesion which has happened in the past, generally once antibiotics are added, the lesions dry up and resolve temporarily.  He has had no fevers or chills, denies any other complaints, no nausea no vomiting    The history is provided by the patient and the spouse.       Prior to Admission medications  Medication Sig Start Date End Date Taking? Authorizing Provider  lidocaine -prilocaine  (EMLA ) cream Apply 1 Application topically as needed. 06/11/24  Yes Isebella Upshur, PA-C  sulfamethoxazole -trimethoprim  (BACTRIM  DS) 800-160 MG tablet Take 1 tablet by mouth 2 (two) times daily for 10 days. 06/11/24 06/21/24 Yes Mariateresa Batra, PA-C  triamcinolone  cream (KENALOG ) 0.1 % Apply 1 Application topically 2 (two) times daily. 06/11/24  Yes Cristo Ausburn, Mliss, PA-C  clotrimazole  (LOTRIMIN ) 1 % cream Apply to affected area 2 times daily 06/08/24   Tate, Kelly H, NP  rosuvastatin  (CRESTOR ) 10 MG tablet Take 1 tablet (10 mg total) by mouth daily. 10/12/23    Kayla Jeoffrey RAMAN, FNP    Allergies: Patient has no known allergies.    Review of Systems  Constitutional:  Negative for fever.  HENT:  Negative for congestion and sore throat.   Eyes: Negative.   Respiratory:  Negative for chest tightness and shortness of breath.   Cardiovascular:  Negative for chest pain.  Gastrointestinal:  Negative for abdominal pain and nausea.  Genitourinary: Negative.   Musculoskeletal:  Negative for arthralgias, joint swelling and neck pain.  Skin:  Positive for color change and wound. Negative for rash.  Neurological:  Negative for dizziness, weakness, light-headedness, numbness and headaches.  Psychiatric/Behavioral: Negative.      Updated Vital Signs BP 139/88   Pulse 78   Temp (!) 97.2 F (36.2 C)   Resp 16   Ht 6' 2 (1.88 m)   Wt 74.8 kg   SpO2 100%   BMI 21.18 kg/m   Physical Exam Vitals and nursing note reviewed.  Constitutional:      Appearance: He is well-developed.  HENT:     Head: Normocephalic and atraumatic.  Eyes:     Conjunctiva/sclera: Conjunctivae normal.  Cardiovascular:     Rate and Rhythm: Normal rate.  Pulmonary:     Effort: Pulmonary effort is normal.  Musculoskeletal:        General: Normal range of motion.     Cervical back: Normal range of motion.  Skin:    General: Skin is warm and dry.  Findings: Lesion present.     Comments: Refer to photos from patient's 1226 urgent care visit.  His foot lesions are generally unchanged from these photos with the exception of his medial heel lesion, there is a superficial pocket of purulent appearing fluid just under the callus on 1 edge of this lesion.  Approximate 1 cm surrounding erythema.  No red streaking.  Neurological:     Mental Status: He is alert.     (all labs ordered are listed, but only abnormal results are displayed) Labs Reviewed  AEROBIC CULTURE W GRAM STAIN (SUPERFICIAL SPECIMEN)    EKG: None  Radiology: DG Foot Complete Left Result Date:  06/11/2024 CLINICAL DATA:  Heel pain. EXAM: LEFT FOOT - COMPLETE 3+ VIEW COMPARISON:  Radiograph 08/20/2018 FINDINGS: There is no evidence of fracture or dislocation. Mild chronic spurring of the first metatarsal phalangeal joint minimal dorsal spurring in the midfoot. No erosions, periostitis or bony destructive change. No plantar calcaneal spur or Achilles tendon enthesophyte. Vascular calcifications are seen. Question edema in the soft tissues about the plantar foot. IMPRESSION: 1. Question edema in the soft tissues about the plantar foot. No acute osseous abnormality. 2. Mild degenerative change of the first metatarsophalangeal joint and midfoot. Electronically Signed   By: Andrea Gasman M.D.   On: 06/11/2024 14:33     Procedures   Apiration of blood/fluid Performed by: Mliss Narrow Consent obtained. Required items: required blood products, implants, devices, and special equipment available Patient identity confirmed: verbally with patient Time out: Immediately prior to procedure a time out was called to verify the correct patient, procedure, equipment, support staff and site/side marked as required. Preparation: Patient was prepped and draped in the usual sterile fashion. Patient tolerance: Patient tolerated the procedure well with no immediate complications.  Location of aspiration: left medial heel. Small amount of purulence obtained. Culture sent   Medications Ordered in the ED  pentafluoroprop-tetrafluoroeth (GEBAUERS) aerosol (has no administration in time range)                                    Medical Decision Making Patient with a acute on chronic left foot rash, dry, scabbed, areas of hyperkeratosis, which has responded historically to antifungals which she started 3 days ago.  Wife is also applying triamcinolone  which she states generally will resolve the rash as well.  Emla  cream for pain, she is asking for refills of these 2 medications.  Additionally with this new  purulence I am adding Bactrim , wound culture has been collected.  He was strongly encouraged to follow-up with podiatry as he probably needs a punch biopsy of 1 of these sites to get a better understanding of the source and chronicity of these lesions.  Patient agrees and understands.  Risk Prescription drug management.        Final diagnoses:  Cellulitis of left lower extremity  Abscess    ED Discharge Orders          Ordered    triamcinolone  cream (KENALOG ) 0.1 %  2 times daily        06/11/24 1528    sulfamethoxazole -trimethoprim  (BACTRIM  DS) 800-160 MG tablet  2 times daily        06/11/24 1528    lidocaine -prilocaine  (EMLA ) cream  As needed        06/11/24 1528               Eriyonna Matsushita,  PA-C 06/13/24 1215    Melvenia Motto, MD 06/16/24 1139  "

## 2024-06-11 NOTE — Discharge Instructions (Signed)
 Take the entire course of the antibiotic prescribed.  As discussed a wound culture has been collected and should be available within about 2 days.  You may check on this result in your MyChart.  You are being covered in case this is MRSA with antibiotic prescribed.  You may continue using the creams as you are doing on the hyperkeratotic areas.  I do recommend a warm Epsom salt soak for your foot twice daily.  Plan to call the podiatrist you were referred to at your prior visit at the urgent care for further evaluation and management of this chronic foot rash.

## 2024-06-11 NOTE — ED Triage Notes (Signed)
 Pt with skin infection to left foot, hurts to walk.  Pt states there is a scab on the heel.

## 2024-06-19 LAB — ORGANISM ID, BACTERIA

## 2024-06-19 LAB — BACTERIAL ORGANISM REFLEX

## 2024-06-21 LAB — AEROBIC CULTURE W GRAM STAIN (SUPERFICIAL SPECIMEN)
Gram Stain: NONE SEEN
Special Requests: NORMAL

## 2024-06-22 ENCOUNTER — Telehealth (HOSPITAL_BASED_OUTPATIENT_CLINIC_OR_DEPARTMENT_OTHER): Payer: Self-pay

## 2024-06-22 NOTE — Telephone Encounter (Signed)
 Post ED Visit - Positive Culture Follow-up  Culture report reviewed by antimicrobial stewardship pharmacist: [facility] [x]  Elma Fail, Pharm.D. []  Venetia Gully, Pharm.D., BCPS AQ-ID []  Garrel Crews, Pharm.D., BCPS []  Almarie Lunger, Pharm.D., BCPS []  [place], [address].D., BCPS, AAHIVP []  Rosaline Bihari, Pharm.D., BCPS, AAHIVP []  Vernell Meier, PharmD, BCPS []  Latanya Hint, PharmD, BCPS []  Donald Medley, PharmD, BCPS []  Rocky Bold, PharmD []  Dorothyann Alert, PharmD, BCPS []  Morene Babe, PharmD  [facility] []  Rosaline Edison, PharmD []  Romona Bliss, PharmD []  Dolphus Roller, PharmD []  Veva Seip, Rph []  Vernell Daunt) Leonce, PharmD []  Eva Allis, PharmD []  Rosaline Millet, PharmD []  Iantha Batch, PharmD []  Arvin Gauss, PharmD []  Wanda Hasting, PharmD []  Ronal Rav, PharmD []  Rocky Slade, PharmD []  Bard Jeans, PharmD   Positive wound culture Treated with Sulfamethoxazole , organism sensitive to the same and no further patient follow-up is required at this time.  [name] 06/22/2024, 9:03 AM
